# Patient Record
Sex: Male | Born: 1935 | Race: White | Hispanic: No | Smoking: Former smoker
Health system: Southern US, Community
[De-identification: ages and names within clinical notes are randomized; demographics above are authoritative.]

## PROBLEM LIST (undated history)

## (undated) DIAGNOSIS — K219 Gastro-esophageal reflux disease without esophagitis: Secondary | ICD-10-CM

## (undated) DIAGNOSIS — I2510 Atherosclerotic heart disease of native coronary artery without angina pectoris: Secondary | ICD-10-CM

## (undated) DIAGNOSIS — M1990 Unspecified osteoarthritis, unspecified site: Secondary | ICD-10-CM

## (undated) DIAGNOSIS — R2681 Unsteadiness on feet: Secondary | ICD-10-CM

## (undated) DIAGNOSIS — R350 Frequency of micturition: Secondary | ICD-10-CM

## (undated) DIAGNOSIS — E785 Hyperlipidemia, unspecified: Secondary | ICD-10-CM

## (undated) DIAGNOSIS — H5440 Blindness, one eye, unspecified eye: Secondary | ICD-10-CM

## (undated) DIAGNOSIS — M316 Other giant cell arteritis: Secondary | ICD-10-CM

## (undated) DIAGNOSIS — C801 Malignant (primary) neoplasm, unspecified: Secondary | ICD-10-CM

## (undated) DIAGNOSIS — H9193 Unspecified hearing loss, bilateral: Secondary | ICD-10-CM

## (undated) HISTORY — PX: CORONARY ANGIOPLASTY: SHX604

## (undated) HISTORY — PX: OTHER SURGICAL HISTORY: SHX169

## (undated) HISTORY — PX: TONSILLECTOMY: SUR1361

## (undated) HISTORY — DX: Hyperlipidemia, unspecified: E78.5

## (undated) HISTORY — DX: Other giant cell arteritis: M31.6

---

## 1998-08-27 ENCOUNTER — Ambulatory Visit (HOSPITAL_BASED_OUTPATIENT_CLINIC_OR_DEPARTMENT_OTHER): Admission: RE | Admit: 1998-08-27 | Discharge: 1998-08-27 | Payer: Self-pay | Admitting: Orthopedic Surgery

## 2001-01-08 ENCOUNTER — Encounter: Payer: Self-pay | Admitting: Emergency Medicine

## 2001-01-08 ENCOUNTER — Emergency Department (HOSPITAL_COMMUNITY): Admission: EM | Admit: 2001-01-08 | Discharge: 2001-01-08 | Payer: Self-pay | Admitting: Emergency Medicine

## 2001-06-10 ENCOUNTER — Ambulatory Visit (HOSPITAL_COMMUNITY): Admission: RE | Admit: 2001-06-10 | Discharge: 2001-06-11 | Payer: Self-pay | Admitting: Interventional Cardiology

## 2001-07-20 ENCOUNTER — Encounter (HOSPITAL_COMMUNITY): Admission: RE | Admit: 2001-07-20 | Discharge: 2001-10-18 | Payer: Self-pay | Admitting: Interventional Cardiology

## 2002-08-30 ENCOUNTER — Encounter: Payer: Self-pay | Admitting: Urology

## 2002-08-30 ENCOUNTER — Ambulatory Visit (HOSPITAL_COMMUNITY): Admission: RE | Admit: 2002-08-30 | Discharge: 2002-08-30 | Payer: Self-pay | Admitting: Urology

## 2002-09-06 ENCOUNTER — Ambulatory Visit: Admission: RE | Admit: 2002-09-06 | Discharge: 2002-12-05 | Payer: Self-pay

## 2003-10-05 ENCOUNTER — Inpatient Hospital Stay (HOSPITAL_BASED_OUTPATIENT_CLINIC_OR_DEPARTMENT_OTHER): Admission: RE | Admit: 2003-10-05 | Discharge: 2003-10-05 | Payer: Self-pay | Admitting: Interventional Cardiology

## 2003-12-29 ENCOUNTER — Ambulatory Visit (HOSPITAL_COMMUNITY): Admission: RE | Admit: 2003-12-29 | Discharge: 2003-12-29 | Payer: Self-pay | Admitting: Gastroenterology

## 2005-02-04 ENCOUNTER — Ambulatory Visit (HOSPITAL_COMMUNITY): Admission: RE | Admit: 2005-02-04 | Discharge: 2005-02-04 | Payer: Self-pay | Admitting: Ophthalmology

## 2005-06-26 ENCOUNTER — Emergency Department (HOSPITAL_COMMUNITY): Admission: EM | Admit: 2005-06-26 | Discharge: 2005-06-26 | Payer: Self-pay | Admitting: Emergency Medicine

## 2005-10-02 ENCOUNTER — Encounter (INDEPENDENT_AMBULATORY_CARE_PROVIDER_SITE_OTHER): Payer: Self-pay | Admitting: *Deleted

## 2005-10-02 ENCOUNTER — Ambulatory Visit: Admission: RE | Admit: 2005-10-02 | Discharge: 2005-10-02 | Payer: Self-pay | Admitting: Internal Medicine

## 2005-10-21 ENCOUNTER — Ambulatory Visit (HOSPITAL_COMMUNITY): Admission: RE | Admit: 2005-10-21 | Discharge: 2005-10-21 | Payer: Self-pay | Admitting: General Surgery

## 2005-10-21 ENCOUNTER — Encounter (INDEPENDENT_AMBULATORY_CARE_PROVIDER_SITE_OTHER): Payer: Self-pay | Admitting: *Deleted

## 2006-04-07 ENCOUNTER — Encounter: Admission: RE | Admit: 2006-04-07 | Discharge: 2006-05-06 | Payer: Self-pay | Admitting: Internal Medicine

## 2006-05-07 ENCOUNTER — Encounter: Admission: RE | Admit: 2006-05-07 | Discharge: 2006-05-25 | Payer: Self-pay | Admitting: Internal Medicine

## 2006-06-19 ENCOUNTER — Encounter: Admission: RE | Admit: 2006-06-19 | Discharge: 2006-06-19 | Payer: Self-pay | Admitting: Orthopedic Surgery

## 2006-07-03 ENCOUNTER — Encounter: Admission: RE | Admit: 2006-07-03 | Discharge: 2006-07-03 | Payer: Self-pay | Admitting: Orthopedic Surgery

## 2006-08-07 ENCOUNTER — Encounter: Admission: RE | Admit: 2006-08-07 | Discharge: 2006-08-07 | Payer: Self-pay | Admitting: Internal Medicine

## 2008-02-03 ENCOUNTER — Encounter: Admission: RE | Admit: 2008-02-03 | Discharge: 2008-02-03 | Payer: Self-pay | Admitting: Sports Medicine

## 2008-02-17 ENCOUNTER — Encounter: Admission: RE | Admit: 2008-02-17 | Discharge: 2008-02-17 | Payer: Self-pay | Admitting: Sports Medicine

## 2008-07-16 ENCOUNTER — Ambulatory Visit: Payer: Self-pay | Admitting: Cardiology

## 2008-07-16 ENCOUNTER — Inpatient Hospital Stay (HOSPITAL_COMMUNITY): Admission: EM | Admit: 2008-07-16 | Discharge: 2008-07-18 | Payer: Self-pay | Admitting: Emergency Medicine

## 2008-08-30 ENCOUNTER — Encounter (HOSPITAL_COMMUNITY): Admission: RE | Admit: 2008-08-30 | Discharge: 2008-11-28 | Payer: Self-pay | Admitting: Interventional Cardiology

## 2008-12-08 ENCOUNTER — Encounter (HOSPITAL_COMMUNITY): Admission: RE | Admit: 2008-12-08 | Discharge: 2009-02-06 | Payer: Self-pay | Admitting: Interventional Cardiology

## 2009-02-07 ENCOUNTER — Encounter (HOSPITAL_COMMUNITY): Admission: RE | Admit: 2009-02-07 | Discharge: 2009-05-08 | Payer: Self-pay | Admitting: Interventional Cardiology

## 2009-10-11 ENCOUNTER — Emergency Department (HOSPITAL_COMMUNITY): Admission: EM | Admit: 2009-10-11 | Discharge: 2009-10-11 | Payer: Self-pay | Admitting: Emergency Medicine

## 2009-10-25 ENCOUNTER — Encounter: Admission: RE | Admit: 2009-10-25 | Discharge: 2009-10-25 | Payer: Self-pay | Admitting: Internal Medicine

## 2010-08-27 LAB — DIFFERENTIAL
Eosinophils Relative: 1 % (ref 0–5)
Lymphocytes Relative: 15 % (ref 12–46)
Lymphs Abs: 1.4 10*3/uL (ref 0.7–4.0)
Monocytes Absolute: 0.5 10*3/uL (ref 0.1–1.0)
Monocytes Relative: 6 % (ref 3–12)

## 2010-08-27 LAB — URINALYSIS, ROUTINE W REFLEX MICROSCOPIC
Hgb urine dipstick: NEGATIVE
Ketones, ur: 15 mg/dL — AB
Nitrite: NEGATIVE
Specific Gravity, Urine: 1.026 (ref 1.005–1.030)
pH: 5.5 (ref 5.0–8.0)

## 2010-08-27 LAB — BASIC METABOLIC PANEL
BUN: 22 mg/dL (ref 6–23)
Creatinine, Ser: 1.06 mg/dL (ref 0.4–1.5)
GFR calc Af Amer: >60 mL/min (ref >60)
Glucose, Bld: 124 mg/dL — ABNORMAL HIGH (ref 70–99)
Sodium: 136 mEq/L (ref 135–145)

## 2010-08-27 LAB — CBC
HCT: 39.6 % (ref 39.0–52.0)
Hemoglobin: 13.9 g/dL (ref 13.0–17.0)
MCHC: 35.1 g/dL (ref 30.0–36.0)
MCV: 98.6 fL (ref 78.0–100.0)
RDW: 14.2 % (ref 11.5–15.5)

## 2010-09-24 LAB — DIFFERENTIAL
Basophils Absolute: 0 10*3/uL (ref 0.0–0.1)
Basophils Relative: 1 % (ref 0–1)
Eosinophils Absolute: 0 10*3/uL (ref 0.0–0.7)
Eosinophils Relative: 0 % (ref 0–5)
Lymphocytes Relative: 15 % (ref 12–46)
Lymphs Abs: 1.2 10*3/uL (ref 0.7–4.0)
Monocytes Absolute: 0.5 10*3/uL (ref 0.1–1.0)
Monocytes Relative: 6 % (ref 3–12)
Neutro Abs: 6 10*3/uL (ref 1.7–7.7)
Neutrophils Relative %: 77 % (ref 43–77)

## 2010-09-24 LAB — CK TOTAL AND CKMB (NOT AT ARMC)
CK, MB: 2.8 ng/mL (ref 0.3–4.0)
CK, MB: 3.2 ng/mL (ref 0.3–4.0)
CK, MB: 3.3 ng/mL (ref 0.3–4.0)
Relative Index: INVALID (ref 0.0–2.5)
Relative Index: INVALID (ref 0.0–2.5)
Relative Index: INVALID (ref 0.0–2.5)
Total CK: 74 U/L (ref 7–232)
Total CK: 89 U/L (ref 7–232)
Total CK: 90 U/L (ref 7–232)

## 2010-09-24 LAB — TROPONIN I
Troponin I: <0.01 ng/mL (ref 0.00–0.06)
Troponin I: <0.01 ng/mL (ref 0.00–0.06)
Troponin I: <0.01 ng/mL (ref 0.00–0.06)

## 2010-09-24 LAB — URINALYSIS, ROUTINE W REFLEX MICROSCOPIC
Bilirubin Urine: NEGATIVE
Glucose, UA: NEGATIVE mg/dL
Hgb urine dipstick: NEGATIVE
Ketones, ur: NEGATIVE mg/dL
Nitrite: NEGATIVE
Protein, ur: NEGATIVE mg/dL
Specific Gravity, Urine: 1.028 (ref 1.005–1.030)
Urobilinogen, UA: 0.2 mg/dL (ref 0.0–1.0)
pH: 6 (ref 5.0–8.0)

## 2010-09-24 LAB — BASIC METABOLIC PANEL
BUN: 28 mg/dL — ABNORMAL HIGH (ref 6–23)
CO2: 27 mEq/L (ref 19–32)
GFR calc Af Amer: >60 mL/min (ref >60)
Glucose, Bld: 124 mg/dL — ABNORMAL HIGH (ref 70–99)
Potassium: 4.3 mEq/L (ref 3.5–5.1)
Sodium: 137 mEq/L (ref 135–145)

## 2010-09-24 LAB — URINE CULTURE
Colony Count: NO GROWTH
Culture: NO GROWTH
Special Requests: NEGATIVE

## 2010-09-24 LAB — CBC
Hemoglobin: 12.4 g/dL — ABNORMAL LOW (ref 13.0–17.0)
Hemoglobin: 13.6 g/dL (ref 13.0–17.0)
MCHC: 34.1 g/dL (ref 30.0–36.0)
RBC: 3.65 MIL/uL — ABNORMAL LOW (ref 4.22–5.81)
RDW: 14 % (ref 11.5–15.5)
WBC: 7.7 10*3/uL (ref 4.0–10.5)

## 2010-09-24 LAB — PROTIME-INR
INR: 1.1 (ref 0.00–1.49)
Prothrombin Time: 14 seconds (ref 11.6–15.2)

## 2010-09-24 LAB — POCT CARDIAC MARKERS
CKMB, poc: 1.3 ng/mL (ref 1.0–8.0)
CKMB, poc: 1.3 ng/mL (ref 1.0–8.0)

## 2010-09-24 LAB — APTT: aPTT: 27 seconds (ref 24–37)

## 2010-09-27 ENCOUNTER — Other Ambulatory Visit: Payer: Self-pay | Admitting: Urology

## 2010-09-27 ENCOUNTER — Encounter (HOSPITAL_COMMUNITY): Payer: Medicare Other | Attending: Urology

## 2010-09-27 DIAGNOSIS — C61 Malignant neoplasm of prostate: Secondary | ICD-10-CM | POA: Insufficient documentation

## 2010-09-27 DIAGNOSIS — Z01812 Encounter for preprocedural laboratory examination: Secondary | ICD-10-CM | POA: Insufficient documentation

## 2010-09-27 DIAGNOSIS — Z79899 Other long term (current) drug therapy: Secondary | ICD-10-CM | POA: Insufficient documentation

## 2010-09-27 DIAGNOSIS — Z7982 Long term (current) use of aspirin: Secondary | ICD-10-CM | POA: Insufficient documentation

## 2010-09-27 DIAGNOSIS — E785 Hyperlipidemia, unspecified: Secondary | ICD-10-CM | POA: Insufficient documentation

## 2010-09-27 DIAGNOSIS — C679 Malignant neoplasm of bladder, unspecified: Secondary | ICD-10-CM | POA: Insufficient documentation

## 2010-09-27 DIAGNOSIS — M1710 Unilateral primary osteoarthritis, unspecified knee: Secondary | ICD-10-CM | POA: Insufficient documentation

## 2010-09-27 LAB — BASIC METABOLIC PANEL
Calcium: 8.8 mg/dL (ref 8.4–10.5)
Creatinine, Ser: 0.97 mg/dL (ref 0.4–1.5)
GFR calc Af Amer: >60 mL/min (ref >60)
GFR calc non Af Amer: >60 mL/min (ref >60)
Sodium: 141 mEq/L (ref 135–145)

## 2010-09-27 LAB — CBC
MCH: 31.1 pg (ref 26.0–34.0)
MCHC: 31.9 g/dL (ref 30.0–36.0)
Platelets: 166 10*3/uL (ref 150–400)
RDW: 14.6 % (ref 11.5–15.5)

## 2010-09-27 LAB — SURGICAL PCR SCREEN
MRSA, PCR: NEGATIVE
Staphylococcus aureus: NEGATIVE

## 2010-10-04 ENCOUNTER — Ambulatory Visit (HOSPITAL_COMMUNITY)
Admission: RE | Admit: 2010-10-04 | Discharge: 2010-10-05 | Disposition: A | Discharge status: Home / Self Care | Payer: Medicare Other | Source: Ambulatory Visit | Attending: Urology | Admitting: Urology

## 2010-10-04 ENCOUNTER — Other Ambulatory Visit: Payer: Self-pay | Admitting: Urology

## 2010-10-04 DIAGNOSIS — M1710 Unilateral primary osteoarthritis, unspecified knee: Secondary | ICD-10-CM | POA: Insufficient documentation

## 2010-10-04 DIAGNOSIS — M353 Polymyalgia rheumatica: Secondary | ICD-10-CM | POA: Insufficient documentation

## 2010-10-04 DIAGNOSIS — R0789 Other chest pain: Secondary | ICD-10-CM | POA: Insufficient documentation

## 2010-10-04 DIAGNOSIS — I2510 Atherosclerotic heart disease of native coronary artery without angina pectoris: Secondary | ICD-10-CM | POA: Insufficient documentation

## 2010-10-04 DIAGNOSIS — Z7982 Long term (current) use of aspirin: Secondary | ICD-10-CM | POA: Insufficient documentation

## 2010-10-04 DIAGNOSIS — K219 Gastro-esophageal reflux disease without esophagitis: Secondary | ICD-10-CM | POA: Insufficient documentation

## 2010-10-04 DIAGNOSIS — C675 Malignant neoplasm of bladder neck: Secondary | ICD-10-CM | POA: Insufficient documentation

## 2010-10-04 DIAGNOSIS — E785 Hyperlipidemia, unspecified: Secondary | ICD-10-CM | POA: Insufficient documentation

## 2010-10-04 DIAGNOSIS — C61 Malignant neoplasm of prostate: Secondary | ICD-10-CM | POA: Insufficient documentation

## 2010-10-04 DIAGNOSIS — R310 Gross hematuria: Secondary | ICD-10-CM | POA: Insufficient documentation

## 2010-10-04 DIAGNOSIS — Z79899 Other long term (current) drug therapy: Secondary | ICD-10-CM | POA: Insufficient documentation

## 2010-10-04 NOTE — Op Note (Signed)
  NAMEMAZI, BRAILSFORD NO.:  [id number]  MEDICAL RECORD NO.:  [id number]           PATIENT TYPE:  O  LOCATION:  DAYL                         FACILITY:  [facility]  PHYSICIAN:  Gaspar Fowle I. Patsi Sears, M.D.DATE OF BIRTH:  1918/05/12  DATE OF PROCEDURE:  10/04/2010 DATE OF DISCHARGE:                              OPERATIVE REPORT   PREOPERATIVE DIAGNOSIS:  Right lateral bladder neck bladder cancer.  POSTOPERATIVE DIAGNOSIS:  Right lateral bladder neck bladder cancer.  OPERATION:  Transurethral resection of the right lobe of the prostate and transurethral resection of bladder neck bladder cancer.  SURGEON:  Steffen Hase I. Patsi Sears, M.D.  ANESTHESIA:  General LMA.  PREPARATION:  After appropriate preanesthesia, the patient was brought to the operating room, placed on the operating room table in dorsal supine position where general LMA anesthesia was introduced.  He was then replaced in dorsal lithotomy position where the pubis was prepped with Betadine solution and draped in usual fashion.  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  He was treated with external beam radiation therapy in 2003 per Dr. Dan Humphreys.  PSA was 0.74 in [place] in 2008.  The patient has been on oxybutynin for radiation cystitis as well as urge incontinence.  The patient had recently been seen by Dr. Perrin Maltese at urgent care for upper respiratory infection with urine specimen showing urinary tract infection and microhematuria.  He was treated with antibiotics for UTI and CT shows bladder mass.  Cystoscopy showed bladder cancer at the bladder neck, behind the right lobe of the prostate.  He is now for resection.  PROCEDURE:  Cystourethroscopy and photo documentation were accomplished of the bladder  neck, bladder cancer, located at the bladder neck and behind the right lobe of the prostate.  A transurethral resection of the right lobe of prostate is accomplished and also resection of the bladder neck to remove all tumor.  The tumor appeared to be superficial.  It is appropriate for mitomycin use in the recovery room.  The patient tolerated the procedure well.  He was awakened and taken to recovery room in good condition.    Nainoa Woldt I. Patsi Sears, M.D.    SIT/MEDQ  D:  10/04/2010  T:  10/04/2010  Job:  [id number]  Electronically Signed by Jethro Bolus M.D. on 10/04/2010 04:54:09 PM

## 2010-10-22 NOTE — Discharge Summary (Signed)
NAMERIESE, HELLARD NO.:  [id number]   MEDICAL RECORD NO.:  [id number]          PATIENT TYPE:  INP   LOCATION:  1411                         FACILITY:  [facility]   PHYSICIAN:  Theressa Millard, M.D.    DATE OF BIRTH:  1917-11-05   DATE OF ADMISSION:  07/16/2008  DATE OF DISCHARGE:  07/18/2008                               DISCHARGE SUMMARY   ADMISSION DIAGNOSIS:  Bilateral lower extremity weakness.   DISCHARGE DIAGNOSES:  1. Bilateral lower extremity weakness, questionable etiology.  2. Fall secondary to number 1.  3. Coronary artery disease.  4. Temporal arteritis, biopsy proven.  5. History of imbalance and falls requiring balance physical therapy      in the past.   HISTORY:  The patient is an 77 year old white male who rather suddenly  developed bilateral lower extremity weakness and was unable to stand.  He fell at home and was helped up by family members.  He could not get  up out of a chair after that.  He is admitted for further evaluation.   HOSPITAL COURSE:  The patient was admitted and initial laboratory data  were unrevealing.  A D-dimer was moderately elevated and a CT scan of  the chest was done.  This ruled out a pulmonary embolism.  Serial  enzymes were negative and EKGs were negative, so it was not thought the  patient has suffered acute coronary syndrome or other coronary event.   He was seen by PT who recommended a rolling walker and home health PT.  He was discharged in improved condition.   DISCHARGE MEDICATIONS:  1. Prednisone 1 mg 2 tablets daily.  2. Aspirin 81 mg daily.  3. Oxybutynin 5 mg b.i.d.  4. Atenolol 25 mg daily.  5. Isosorbide mononitrate 120 mg daily.  6. Pravastatin 40 mg daily.  7. Ranexa 500 mg b.i.d..   FOLLOW UP:  He has an appointment with me in April and will keep that  appointment.   ACTIVITY:  Per home health PT, use rolling walker.   DIET:  No added salt.      Theressa Millard, M.D.  Electronically  Signed     JO/MEDQ  D:  07/18/2008  T:  07/18/2008  Job:  [id number]

## 2010-10-22 NOTE — H&P (Signed)
NAMEMOHAMUD, Derrick Andrews NO.:  [id number]   MEDICAL RECORD NO.:  [id number]          PATIENT TYPE:  EMS   LOCATION:  ED                           FACILITY:  [facility]   PHYSICIAN:  Darryl D. Prime, MD    DATE OF BIRTH:  1917-09-05   DATE OF ADMISSION:  07/16/2008  DATE OF DISCHARGE:                              HISTORY & PHYSICAL   CHIEF COMPLAINT:  Not feeling right, weak all over, thought I was  having a heart attack.   HISTORY OF PRESENT ILLNESS:  Derrick Andrews is an 77 year old male with a  history of coronary artery disease who noticed today around 11:30 a.m.,  profound weakness, shortness of breath, unsteady gait because of the leg  weakness.  He sat down and took a nitroglycerin because he thought he  was having a heart attack, laid down for two hours, took a nap, woke up,  and still could not walk well.  He also had upper extremity weakness.  The patient was brought here by his daughter who he lives with.  He has  been eating and drinking okay.  He has not had any chest pain.  He  denies any paroxysmal or nocturnal dyspnea.  He denies any orthopnea.  He denies any lower extremity edema.  The patient was noted by his niece  and his daughter to be ashen all week and not looking right.   PAST MEDICAL/SURGICAL HISTORY:  1. History of coronary artery disease, status post PCI in 2003.  2. LAD cath, last here in April of 2005 showing mild to moderate LAD      in stent stenosis, medically managed, EF of 65%.  He had a mildly      abnormal Cardiolite at that time which prompted the study.  The      patient notes he has had PCI since then and a stress test about 4-5      months ago that was unremarkable.  3. History of hyperlipidemia.  4. History of hypertension.  5. History of back pain.  6. Hearing aid status.  7. He has a history of blindness of the left eye, likely due to      temporal arteritis.  He is on prednisone for this.  8. History of fall in January 2007 with  significant fractures in the      hands requiring reductions of this.   ALLERGIES:  No known drug allergies.   MEDICATIONS:  1. He is on nitroglycerin sublingual as needed.  2. Prednisone 2 mg daily.  3. Pravastatin possibly 80 mg daily.  4. Ranexa 500 mg twice a day.  5. Isosorbide mononitrate 60 mg daily.  6. __________, unsure of the exact dose.  7. Atenolol.  He is unsure of the exact dose.  8. Oxybutynin twice a day, unsure of the exact dose.  9. Aspirin 81 mg daily.   SOCIAL HISTORY:  No current tobacco, alcohol, or drug use.  He works at  [employer].  Good ADLs.   FAMILY HISTORY:  Father died of heart failure.   REVIEW OF  SYSTEMS:  A 14-point review of systems negative unless stated  above.   PHYSICAL EXAMINATION:  VITAL SIGNS:  Temperature 97 with blood pressure  of 107/60, pulse 62, respiratory rate of 14, saturations are 96% on room  air.  GENERAL:  In general the patient is in no acute distress.  He notes  improvement of his symptoms after receiving IV fluids.  HEENT:  Normocephalic, atraumatic.  Pupils equal, round, and reactive to  light with extraocular movements intact.  NECK:  Supple with no lymphadenopathy or thyromegaly.  No jugular venous  distention.  No carotid bruits.  LUNGS:  Clear to auscultation bilaterally.  CARDIOVASCULAR:  Regular rate and rhythm with no murmurs, rubs, or  gallops.  Normal S1/S2.  No S3 or S4.  ABDOMEN:  Soft, nontender, nondistended.  No hepatosplenomegaly.  EXTREMITIES:  Show no clubbing, cyanosis, or edema.  NEUROLOGIC:  He is alert and oriented x4.  Cranial nerves II-XII are  grossly intact.  Strength and sensation grossly intact.   LABORATORY DATA:  The patient's white count is 7.7 with hemoglobin of  13.7, hematocrit 39.7, platelets 125, segs were 77%.  Sodium 137 with a  potassium of 4.3, chloride 107, bicarb 27, BUN 28, creatinine 1, glucose  128.  Calcium is 8.9.  Cardiac markers were negative.   Chest  x-ray showed no evidence of cardiopulmonary disease.  EKG showed  sinus bradycardia at 58 beats per minute, QT corrected 455.   ASSESSMENT/PLAN:  This is a patient with a history of significant  coronary artery disease who presents with profound weakness who thought  he was having a heart attack.  PTIs in the past, but this time he was  admitted for acute coronary syndrome given his recent ashen history per  the family and this feeling that he is having a heart attack.  He will  be on heparin and aspirin and will get a cardiology consult in the  morning.  Will transfer him to [facility] as well given possible acute  coronary syndrome status.  Will continue his other medications but will  hold isosorbide mononitrate and will give general hydration and maybe a  component of hypovolemia possibly with orthostasis.  He was already  given fluids in the ER.      Darryl D. Prime, MD  Electronically Signed     DDP/MEDQ  D:  07/16/2008  T:  07/17/2008  Job:  [id number]

## 2010-10-25 NOTE — Op Note (Signed)
NAMEMONTRE, Andrews                 ACCOUNT NO.:  [id number]   MEDICAL RECORD NO.:  [id number]          PATIENT TYPE:  AMB   LOCATION:  DAY                          FACILITY:  [facility]   PHYSICIAN:  Timothy E. Earlene Plater, M.D. DATE OF BIRTH:  04/26/1918   DATE OF PROCEDURE:  10/21/2005  DATE OF DISCHARGE:                                 OPERATIVE REPORT   PREOPERATIVE DIAGNOSIS:  Probable arteritis.   POSTOPERATIVE DIAGNOSIS:  Probable arteritis.   PROCEDURE:  Bilateral temporal artery biopsy.   SURGEON:  Timothy E. Earlene Plater, M.D.   ANESTHESIA:  Local with IV sedation.   [name] is 5, quite healthy for his age, under full care of Theressa Millard,  M.D., had recent sudden loss of vision, left eye.  Workup by ophthalmology  specialist suggested arteritis.  The patient was placed on 60 mg of  prednisone daily and temporal artery biopsies were recommended by his  ophthalmologist and internist.  I agreed, the patient agreed.  He was seen,  identified and a permit signed.   The patient was taken to the operating room and placed supine, carefully  positioned.  Just a small amount of IV sedation used.  Both sides of the  scalp were prepped with Betadine.  Using the Doppler, separately, of course,  on each side, the temporal artery was clearly identified and the skin  marked.  Local anesthesia was provided with 1% Xylocaine with no epinephrine  and a vertical incision made over the marked areas.  Each was treated  exactly the same.  The artery was identified visually, again verified by  ultrasound Doppler, and then isolated, clipped proximally and distally and a  1 cm segment taken from each temporal artery.  Bleeding had been controlled.  The edges were touched up with the cautery where needed and the wounds were  perfectly dry.  The wounds were each closed with subcuticular 3-0 Vicryl and  the edges were perfectly apposed.  The edges were sealed with Ethibond.  Counts correct.  He tolerated it  well, was removed to the recovery room.   Written and verbal instructions were given him and his family, and he will  be followed as an outpatient.      Timothy E. Earlene Plater, M.D.  Electronically Signed     TED/MEDQ  D:  10/21/2005  T:  10/21/2005  Job:  [id number]   cc:   Theressa Millard, M.D.  Fax: [phone]

## 2010-10-25 NOTE — Cardiovascular Report (Signed)
NAME:  AKRAM, KISSICK                           ACCOUNT NO.:  [id number]   MEDICAL RECORD NO.:  [id number]                   PATIENT TYPE:  OIB   LOCATION:  6501                                 FACILITY:  [facility]   PHYSICIAN:  Lesleigh Noe, M.D.            DATE OF BIRTH:  [birth year]   DATE OF PROCEDURE:  10/05/2003  DATE OF DISCHARGE:  10/05/2003                              CARDIAC CATHETERIZATION   INDICATIONS FOR PROCEDURE:  Exertional angina, mildly abnormal Cardiolite,  history of LAD stent in 2003.   DATE OF PROCEDURE:  Nov 04, 2003.   PROCEDURE PERFORMED:  1. Left heart catheterization.  2. Selective coronary angiography.  3. Left ventriculography.   DESCRIPTION:  After informed consent, a 4-French sheath was placed in the  right femoral artery using modified Seldinger technique.  A 4-French #4 left  Judkins catheter was used for left coronary angiography and a 4-French right  Judkins catheter was used for right coronary angiography and left  ventriculography as well as hemodynamic recordings.  The patient tolerated  the procedure without complications.  During left coronary injections, 200  mcg of intracoronary nitroglycerin was administered.  No complications  occurred during the procedure.   RESULTS:   I. HEMODYNAMIC DATA:  A.  Aortic pressure 125/53.  B.  Left ventricular pressure 132/13.   II. LEFT VENTRICULOGRAPHY:  The left ventricle is normal in size and  exhibits normal contractility.  EF of 65%.  No regional wall motion  abnormality.   III. CORONARY ANGIOGRAPHY:  A.  Left main coronary:  Calcified, but widely  patent.  B.  Left anterior descending coronary:  The LAD is heavily calcified.  There  is a proximal/mid LAD stent.  In the proximal 1/3 of the stent there is a  region of restenosis that seems to obstruct the stent by 40-60%.  The very  worse view is in the LAO cranial view, but there appears to be a 60-65%  narrowing within this region.  The  LAD otherwise contains luminal  irregularities.  The first diagonal is large and contains a 50% ostial  narrowing.  LAD wraps around the left ventricular apex.  C.  Circumflex artery:  Large giving origin to small first and third obtuse  marginal and a large second obtuse marginal.  No significant obstruction is  noted.  Luminal irregularities are seen.  D.  Right coronary:  Large and somewhat ectatic with diffuse luminal  irregularities up to 30-40% narrowing.  The PDA and two left ventricular  branches are free of any significant high grade obstruction.   CONCLUSIONS:  1. Mild-to-moderate left anterior descending in-stent restenosis in the 50-     60% range.  2. Otherwise widely patent coronaries.  There is heavy calcification in all     three coronary beds.  3. Left ventricular function is normal.   RECOMMENDATIONS:  Continue medical therapy.  Review cine angiograms  performed at the time of stent implantation.  If symptoms continue, would  consider redilating the LAD and possibly restenting.                                               Lesleigh Noe, M.D.    HWS/MEDQ  D:  10/05/2003  T:  10/05/2003  Job:  [id number]   cc:   Candyce Churn, M.D.  [address] [place]  [place] [address]  Fax: [phone]

## 2010-11-27 ENCOUNTER — Other Ambulatory Visit: Payer: Self-pay | Admitting: Sports Medicine

## 2010-11-27 DIAGNOSIS — M4800 Spinal stenosis, site unspecified: Secondary | ICD-10-CM

## 2010-11-28 ENCOUNTER — Ambulatory Visit
Admission: RE | Admit: 2010-11-28 | Discharge: 2010-11-28 | Disposition: A | Discharge status: Home / Self Care | Payer: Medicare Other | Source: Ambulatory Visit | Attending: Sports Medicine | Admitting: Sports Medicine

## 2010-11-28 DIAGNOSIS — M4800 Spinal stenosis, site unspecified: Secondary | ICD-10-CM

## 2011-01-15 ENCOUNTER — Other Ambulatory Visit: Payer: Self-pay | Admitting: Sports Medicine

## 2011-01-15 DIAGNOSIS — M4800 Spinal stenosis, site unspecified: Secondary | ICD-10-CM

## 2011-01-16 ENCOUNTER — Ambulatory Visit
Admission: RE | Admit: 2011-01-16 | Discharge: 2011-01-16 | Disposition: A | Discharge status: Home / Self Care | Payer: Medicare Other | Source: Ambulatory Visit | Attending: Sports Medicine | Admitting: Sports Medicine

## 2011-01-16 DIAGNOSIS — M4800 Spinal stenosis, site unspecified: Secondary | ICD-10-CM

## 2011-01-16 MED ORDER — METHYLPREDNISOLONE ACETATE 40 MG/ML INJ SUSP (RADIOLOG
120.0000 mg | Freq: Once | INTRAMUSCULAR | Status: AC
  Administered 2011-01-16: 120 mg via EPIDURAL

## 2011-01-16 MED ORDER — IOHEXOL 180 MG/ML  SOLN
1.0000 mL | Freq: Once | INTRAMUSCULAR | Status: AC | PRN
  Administered 2011-01-16: 1 mL via EPIDURAL

## 2011-02-04 ENCOUNTER — Other Ambulatory Visit: Payer: Self-pay | Admitting: Sports Medicine

## 2011-02-04 DIAGNOSIS — M4800 Spinal stenosis, site unspecified: Secondary | ICD-10-CM

## 2011-02-05 ENCOUNTER — Ambulatory Visit
Admission: RE | Admit: 2011-02-05 | Discharge: 2011-02-05 | Disposition: A | Discharge status: Home / Self Care | Payer: Medicare Other | Source: Ambulatory Visit | Attending: Sports Medicine | Admitting: Sports Medicine

## 2011-02-05 DIAGNOSIS — M4800 Spinal stenosis, site unspecified: Secondary | ICD-10-CM

## 2011-02-05 MED ORDER — METHYLPREDNISOLONE ACETATE 40 MG/ML INJ SUSP (RADIOLOG
120.0000 mg | Freq: Once | INTRAMUSCULAR | Status: AC
  Administered 2011-02-05: 120 mg via EPIDURAL

## 2011-02-05 MED ORDER — IOHEXOL 180 MG/ML  SOLN
1.0000 mL | Freq: Once | INTRAMUSCULAR | Status: AC | PRN
  Administered 2011-02-05: 1 mL via EPIDURAL

## 2011-02-25 ENCOUNTER — Other Ambulatory Visit: Payer: Self-pay | Admitting: Urology

## 2011-02-25 ENCOUNTER — Other Ambulatory Visit (HOSPITAL_COMMUNITY): Payer: Self-pay | Admitting: Urology

## 2011-02-25 ENCOUNTER — Ambulatory Visit (HOSPITAL_COMMUNITY)
Admission: RE | Admit: 2011-02-25 | Discharge: 2011-02-25 | Disposition: A | Discharge status: Home / Self Care | Payer: Medicare Other | Source: Ambulatory Visit | Attending: Urology | Admitting: Urology

## 2011-02-25 ENCOUNTER — Encounter (HOSPITAL_COMMUNITY): Payer: Medicare Other

## 2011-02-25 DIAGNOSIS — J449 Chronic obstructive pulmonary disease, unspecified: Secondary | ICD-10-CM | POA: Insufficient documentation

## 2011-02-25 DIAGNOSIS — C679 Malignant neoplasm of bladder, unspecified: Secondary | ICD-10-CM | POA: Insufficient documentation

## 2011-02-25 DIAGNOSIS — J4489 Other specified chronic obstructive pulmonary disease: Secondary | ICD-10-CM | POA: Insufficient documentation

## 2011-02-25 DIAGNOSIS — Z01818 Encounter for other preprocedural examination: Secondary | ICD-10-CM

## 2011-02-25 DIAGNOSIS — Z01812 Encounter for preprocedural laboratory examination: Secondary | ICD-10-CM | POA: Insufficient documentation

## 2011-02-25 DIAGNOSIS — J984 Other disorders of lung: Secondary | ICD-10-CM | POA: Insufficient documentation

## 2011-02-25 LAB — BASIC METABOLIC PANEL
BUN: 20 mg/dL (ref 6–23)
Chloride: 104 mEq/L (ref 96–112)
GFR calc Af Amer: >60 mL/min (ref >60)
Potassium: 4.1 mEq/L (ref 3.5–5.1)

## 2011-02-25 LAB — SURGICAL PCR SCREEN
MRSA, PCR: NEGATIVE
Staphylococcus aureus: NEGATIVE

## 2011-02-25 LAB — CBC
HCT: 34 % — ABNORMAL LOW (ref 39.0–52.0)
Platelets: 128 10*3/uL — ABNORMAL LOW (ref 150–400)
RDW: 15.3 % (ref 11.5–15.5)
WBC: 7.7 10*3/uL (ref 4.0–10.5)

## 2011-03-03 ENCOUNTER — Other Ambulatory Visit: Payer: Self-pay | Admitting: Urology

## 2011-03-03 ENCOUNTER — Ambulatory Visit (HOSPITAL_COMMUNITY)
Admission: RE | Admit: 2011-03-03 | Discharge: 2011-03-03 | Disposition: A | Discharge status: Home / Self Care | Payer: Medicare Other | Source: Ambulatory Visit | Attending: Urology | Admitting: Urology

## 2011-03-03 DIAGNOSIS — R3915 Urgency of urination: Secondary | ICD-10-CM | POA: Insufficient documentation

## 2011-03-03 DIAGNOSIS — R350 Frequency of micturition: Secondary | ICD-10-CM | POA: Insufficient documentation

## 2011-03-03 DIAGNOSIS — C672 Malignant neoplasm of lateral wall of bladder: Secondary | ICD-10-CM | POA: Insufficient documentation

## 2011-03-03 DIAGNOSIS — I10 Essential (primary) hypertension: Secondary | ICD-10-CM | POA: Insufficient documentation

## 2011-03-03 DIAGNOSIS — C61 Malignant neoplasm of prostate: Secondary | ICD-10-CM | POA: Insufficient documentation

## 2011-03-03 DIAGNOSIS — R310 Gross hematuria: Secondary | ICD-10-CM | POA: Insufficient documentation

## 2011-03-03 DIAGNOSIS — Z01812 Encounter for preprocedural laboratory examination: Secondary | ICD-10-CM | POA: Insufficient documentation

## 2011-03-03 DIAGNOSIS — Y842 Radiological procedure and radiotherapy as the cause of abnormal reaction of the patient, or of later complication, without mention of misadventure at the time of the procedure: Secondary | ICD-10-CM | POA: Insufficient documentation

## 2011-03-03 DIAGNOSIS — Z9861 Coronary angioplasty status: Secondary | ICD-10-CM | POA: Insufficient documentation

## 2011-03-03 DIAGNOSIS — N419 Inflammatory disease of prostate, unspecified: Secondary | ICD-10-CM | POA: Insufficient documentation

## 2011-03-03 DIAGNOSIS — N3941 Urge incontinence: Secondary | ICD-10-CM | POA: Insufficient documentation

## 2011-03-03 DIAGNOSIS — K219 Gastro-esophageal reflux disease without esophagitis: Secondary | ICD-10-CM | POA: Insufficient documentation

## 2011-03-03 DIAGNOSIS — Z01818 Encounter for other preprocedural examination: Secondary | ICD-10-CM | POA: Insufficient documentation

## 2011-03-14 NOTE — Op Note (Signed)
  NAMEJAYDIEN, PANEPINTO NO.:  [id number]  MEDICAL RECORD NO.:  [id number]  LOCATION:  DAYL                         FACILITY:  [facility]  PHYSICIAN:  Ellarie Picking I. Patsi Sears, M.D.DATE OF BIRTH:  [birth year]  DATE OF PROCEDURE: DATE OF DISCHARGE:  03/03/2011                              OPERATIVE REPORT   PREOPERATIVE DIAGNOSES: 1. 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 2. Recurrent transitional cell carcinoma of the bladder.  OPERATIONS:  Cystourethroscopy, clot evacuation, transurethral cauterization of bleeding prostate, transurethral cold cup bladder biopsies, and transurethral vaporization of recurrent transitional cell carcinoma of the bladder.  SURGEON:  Nyesha Cliff I. Patsi Sears, M.D.  ANESTHESIA:  General LMA.  OPERATION:  Appropriate preanesthesia, the patient was brought to the operating room and placed on the operating table in dorsal supine position where general LMA anesthesia was induced.  He was then replaced in dorsal lithotomy position where the pubis was prepped with Betadine solution and draped in the usual fashion.  REVIEW OF HISTORY:  This 77 year old male has a history of prostate cancer, treated in 2003, with external beam radiation therapy.  He has been on oxybutynin for urinary urgency, frequency, post radiation therapy, with some urge incontinence.  The patient developed gross hematuria, was found to have bleeding from his prostate.  He has a more recent history of bladder cancer, status post TURBT in April 2012, showing a low grade TA disease in a single area.  Upper tract evaluation showed a single exophytic cyst in the left mid renal pole.  Most recently, the patient developed recurrent gross hematuria, and bleeding in the office was found secondary to prostate bleeding.  He is in now for cauterization of the prostate.  PROCEDURE:  Cystourethroscopy was accomplished, and showed frank bleeding and clot at the bladder neck  emanating from the prostate.  The clots were evacuated free from bladder, and using the button electrode, vaporization of the prostate bed was accomplished with resolution of the bleeding.  Cystoscopy revealed recurrent TCC of the bladder, on the right lateral wall in 2 areas, and this was cold cup bladder biopsied and then vaporized with the button electrode.  The patient tolerated the procedure well.  A 20-French Foley catheter was placed, with 30 mL in the balloon.  Clear efflux was identified from the bladder, and the irrigant was also clear. The patient was awakened and taken to the recovery room in good condition.     Nyasha Rahilly I. Patsi Sears, M.D.     SIT/MEDQ  D:  03/03/2011  T:  03/03/2011  Job:  [id number]  Electronically Signed by Jethro Bolus M.D. on 03/14/2011 12:46:42 PM

## 2011-05-21 ENCOUNTER — Ambulatory Visit (INDEPENDENT_AMBULATORY_CARE_PROVIDER_SITE_OTHER): Payer: Medicare Other

## 2011-05-21 DIAGNOSIS — S81009A Unspecified open wound, unspecified knee, initial encounter: Secondary | ICD-10-CM

## 2011-05-21 DIAGNOSIS — S91009A Unspecified open wound, unspecified ankle, initial encounter: Secondary | ICD-10-CM

## 2011-05-24 ENCOUNTER — Ambulatory Visit (INDEPENDENT_AMBULATORY_CARE_PROVIDER_SITE_OTHER): Payer: Medicare Other

## 2011-05-24 DIAGNOSIS — S81009A Unspecified open wound, unspecified knee, initial encounter: Secondary | ICD-10-CM

## 2011-05-30 ENCOUNTER — Ambulatory Visit (INDEPENDENT_AMBULATORY_CARE_PROVIDER_SITE_OTHER): Payer: Medicare Other

## 2011-05-30 DIAGNOSIS — S81009A Unspecified open wound, unspecified knee, initial encounter: Secondary | ICD-10-CM

## 2011-07-11 ENCOUNTER — Ambulatory Visit (HOSPITAL_BASED_OUTPATIENT_CLINIC_OR_DEPARTMENT_OTHER): Payer: Medicare Other | Admitting: Physical Medicine & Rehabilitation

## 2011-07-11 ENCOUNTER — Encounter: Payer: Medicare Other | Attending: Physical Medicine & Rehabilitation

## 2011-07-11 DIAGNOSIS — M5126 Other intervertebral disc displacement, lumbar region: Secondary | ICD-10-CM | POA: Insufficient documentation

## 2011-07-11 DIAGNOSIS — M545 Low back pain, unspecified: Secondary | ICD-10-CM | POA: Insufficient documentation

## 2011-07-11 DIAGNOSIS — M48061 Spinal stenosis, lumbar region without neurogenic claudication: Secondary | ICD-10-CM | POA: Insufficient documentation

## 2011-07-11 DIAGNOSIS — Z7982 Long term (current) use of aspirin: Secondary | ICD-10-CM | POA: Insufficient documentation

## 2011-07-11 DIAGNOSIS — M47817 Spondylosis without myelopathy or radiculopathy, lumbosacral region: Secondary | ICD-10-CM | POA: Insufficient documentation

## 2011-07-11 DIAGNOSIS — Z79899 Other long term (current) drug therapy: Secondary | ICD-10-CM | POA: Insufficient documentation

## 2011-07-11 NOTE — Progress Notes (Signed)
This is a 78 year old male, who has lumbar spinal stenosis, chronic low back pain of greater than 10 year duration who has primary complaint of low back pain without radicular symptoms.  He has been evaluated by numerous physicians throughout the years including Dr. Fannie Knee and then Dr. Salvatore Marvel.  MRI from 2008, demonstrates facet overgrowth L2-3 and borderline central stenosis at that level.  At L3-4, lateral recess disk protrusion as well as facet overgrowth and moderate central stenosis, mild foraminal stenosis at L4-5, right paracentral lateral disk extrusion, facet overgrowth, moderate central stenosis, moderate left and mild foraminal, right foraminal stenosis at L5-S1, bilateral facet overgrowth, synovial cyst without evidence of significant foraminal stenosis and only moderate central stenosis.  He has undergone epidural injections right L5 transforaminal June 19, 2006 and July 03, 2006 and in February 17, 2008, he also had L4-5 translaminar February 03, 2008 and most recently L4-5 translaminar injections February 05, 2011, January 16, 2011 and November 28, 2010.  He gets transient relief of his back pain from these injections.  His average pain is 8/10 and interferes with activity at 7.  Pain is worse with walking and standing.  He is able to walk without assistance.  He is able to drive, but he does have trouble walking for longer distances. Standing also bothers him.  He has coronary artery disease as well as arthritis.  SOCIAL HISTORY:  Widowed, lives with his daughter.  MEDICATIONS: 1. Tramadol 1 p.o. q.6 h. 2. Isosorbide dinitrate 120 mg sustained action 1 p.o. daily. 3. Oxybutynin 1 p.o. b.i.d. for urinary incontinence. 4. Vitamin D 1.25 mg every weekly. 5. Ferrous sulfate 325 mg per day. 6. Pravastatin 40 mg at bedtime. 7. Prednisone 1 mg 2 p.o. daily. 8. Atenolol 25 mg q.a.m. 9. Aspirin 81 mg q.a.m. 10.Nitroglycerin sublingual tablet 0.4 mg p.r.n. 11.B12 injections  weekly.  ALLERGIES:  None known.  Pharmacy [place] and [place].  PHYSICAL EXAMINATION:  VITAL SIGNS:  Blood pressure 112/43, pulse 104, respiratory rate is 18 and O2 sat 91% on room air.  Weight 190 pounds and height 6 feet 0 inch. GENERAL:  Elderly male in a kyphotic posture in no acute distress. EXTREMITIES:  Upper extremity strength is normal.  Neck range of motion is reduced.  His sensation in the upper and lower extremities is normal. Lower extremity has normal strength, sensation and mildly diminished internal hip rotation, but otherwise knee and ankle range of motion is full.  Deep tendon reflexes are 1+ bilateral upper and lower extremities.  His back has no tenderness to palpation.  He does have limited lumbar extension, but lumbar flexion is 75%.  Gait shows no evidence of toe drag or knee instability.  IMPRESSION:  Lumbar stenosis, but no evidence of radiculopathy.  I believe his pain generator is likely facet joints due to spondylosis.  PLAN:  We will set him up for medial branch blocks, followed by some physical therapy, may need some radiofrequency neurotomy if only temporary relief from medial branch blocks.  Discussed with patient, agrees with plan.     Erick Colace, M.D. Electronically Signed    AEK/MedQ D:  07/11/2011 15:43:46  T:  07/11/2011 19:11:19  Job #:  [id number]  cc:   Alvy Beal, MD Fax: [phone]

## 2011-08-07 ENCOUNTER — Encounter: Payer: Medicare Other | Admitting: Physical Medicine & Rehabilitation

## 2011-09-04 ENCOUNTER — Ambulatory Visit (HOSPITAL_BASED_OUTPATIENT_CLINIC_OR_DEPARTMENT_OTHER): Payer: Medicare Other | Admitting: Physical Medicine & Rehabilitation

## 2011-09-04 ENCOUNTER — Encounter: Payer: Self-pay | Admitting: Physical Medicine & Rehabilitation

## 2011-09-04 ENCOUNTER — Encounter: Payer: Medicare Other | Attending: Physical Medicine & Rehabilitation

## 2011-09-04 VITALS — BP 109/53 | HR 61 | Resp 16 | Ht 75.0 in | Wt 190.0 lb

## 2011-09-04 DIAGNOSIS — M47817 Spondylosis without myelopathy or radiculopathy, lumbosacral region: Secondary | ICD-10-CM | POA: Insufficient documentation

## 2011-09-04 DIAGNOSIS — M5126 Other intervertebral disc displacement, lumbar region: Secondary | ICD-10-CM | POA: Insufficient documentation

## 2011-09-04 DIAGNOSIS — Z79899 Other long term (current) drug therapy: Secondary | ICD-10-CM | POA: Insufficient documentation

## 2011-09-04 DIAGNOSIS — M48061 Spinal stenosis, lumbar region without neurogenic claudication: Secondary | ICD-10-CM | POA: Insufficient documentation

## 2011-09-04 DIAGNOSIS — M545 Low back pain, unspecified: Secondary | ICD-10-CM | POA: Insufficient documentation

## 2011-09-04 DIAGNOSIS — Z7982 Long term (current) use of aspirin: Secondary | ICD-10-CM | POA: Insufficient documentation

## 2011-09-04 NOTE — Progress Notes (Signed)
  PROCEDURE RECORD [facility]   Name: Derrick Andrews DOB:06-Dec-1917 MRN: [id number]  Date:09/04/2011  Physician: Claudette Laws, MD    Nurse/CMA: Noralyn Pick, CMA  Allergies: No Known Allergies  Consent Signed: yes  Is patient diabetic? no    Pregnant: no LMP: No LMP for male patient. (age [age])  Anticoagulants: yes (ASA 81mg ) Anti-inflammatory: no Antibiotics: no  Procedure: Medial Branch Block  Position: Prone Start Time: 12:49pm  End Time: 1:00pm  Fluoro Time: 24 seconds  RN/CMA Noralyn Pick, CMA Noralyn Pick, CMA    Time 12:20pm 1:03pm    BP 109/53 115/47    Pulse 61 72    Respirations 16 16    O2 Sat 98 98    S/S 6 6    Pain Level 7-8/10 7/10     D/C home with Loran Senters, patient A & O X 3, D/C instructions reviewed, and sits independently.

## 2011-09-04 NOTE — Patient Instructions (Signed)
Facet Block A facet block is an injection procedure used to numb nerves near a spinal joint (facet). The injection usually includes a medicine like Novacaine (anesthetic) and a steroid medicine (similar to cortisone). The injections are made directly into the facet joint of the back. They are used for patients with several types of neck or back pain problems (such as worsening arthritis or persistent pain after surgery) that have not been helped with anti-inflammatory medications, exercise programs, physical therapy, and other forms of pain management. Multiple injections may be needed depending on how many joints are involved.  A facet block procedure can be helpful with diagnosis as well as providing therapeutic pain relief. One of three things may happen after the procedure:  The pain does not go away. This can mean that the pain is probably not coming from blocked facet joints. This information is helpful with diagnosis.   The pain goes away and stays away for a few hours but the original pain comes back and does not get better again. This information is also helpful with diagnosis. It can mean that pain is probably coming from the joints; but the steroid was not helpful for longer term pain control.   The pain goes away after the block, then returns later that day, and then gets better again over the next few days. This can mean that the block was helpful for pain control and the steroid had a longer lasting effect.  If there is good, lasting benefit from the injections, the block may be repeated from 3 to 5 times. If there is good relief but it is only of short-term benefit, other procedures (such as radiofrequency lesioning) may be considered.  Note: The procedure cannot be performed if you have an active infection, a lesion on or near the area of injection, flu, cold, fever, very high blood pressure or if you are on blood thinners. Please make your doctor aware of any of these conditions. This is  for your safety!  LET YOUR CAREGIVER KNOW ABOUT:   Allergies.   Medications taken including herbs, eye drops, over the counter medications, and creams   Use of steroids (by mouth or creams).   Possible pregnancy, if applicable.   Previous problems with anesthetics or Novocaine.   History of blood clots.   History of bleeding or blood problems.   Previous surgery, particularly of the neck and/or back   Other health problems.  RISKS AND COMPLICATIONS These are very uncommon but include:  Bleeding.   Injury to a nerve near the injection site.   Weakness or numbness in areas controlled by nerves near the injection site.   Infection.   Pain at the site of the injection.   Temporary fluid retention in those who are prone to this problem.   Allergic reaction to anesthetics or medicines used during the procedure.  Diabetics may have a temporary increase in their blood sugar after any surgical procedure, especially if steroids are used. Stinging/burning of the numbing medicine is the most uncomfortable part of the procedure; however every person's response to any procedure is individual.  BEFORE THE PROCEDURE   Your caregiver will provide instructions about stopping any medication before the procedure.   Unless advised otherwise, if the injections are in your neck, you may take your medications as usual with a sip of water but do not eat or drink for 6 hours before the procedure.   Unless advised otherwise, you may eat, drink and take your medications as   usual on the day of the procedure (both before and after) if the injections are to be in your lower back.   There is no other specific preparation necessary unless advised otherwise.  PROCEDURE After checking your blood pressure, the procedure will be done in the x-ray (fluoroscopy) room while lying on your stomach. For procedures in the neck, an intravenous line is usually started. The back is then cleansed with an antiseptic  soap. Sterile drapes are placed in this area. The skin is numbed with a local anesthetic. This is felt as a stinging or burning sensation. Using x-ray guidance, needles are then advanced to the appropriate locations. Once the needles are in the proper location, the anesthetic and steroid is injected through the needles and the needles are removed. The skin is then cleansed and bandages are applied. Blood pressure will be checked again, and you will be discharged to leave with your ride after your caregiver says it is okay to go.  AFTER THE PROCEDURE  You may not drive for the remainder of the day after your procedure. An adult must be present to drive you home or to go with you in a taxi or on public transportation. The procedure will be canceled if you do not have a responsible adult with you! This is for your safety.  HOME CARE INSTRUCTIONS   The bandages noted above can be removed on the morning after the procedure.   Resume medications according to your caregiver's instructions.   No heat is to be used near or over the injected area(s) for the remainder of the day.   No tub bath or soaking in water (such as a pool, jacuzzi, etc.) for the remainder of the day.   Some local tenderness may be experienced for a couple of days after the injection. Using an ice pack three or four times a day will help this.   Keep track of the amount of pain relief as well as how long the pain relief lasted.  SEEK MEDICAL CARE IF:   There is drainage from the injection site.   Pain is not controlled with medications prescribed.   There is significant bleeding or swelling.  SEEK IMMEDIATE MEDICAL CARE IF:   You develop a fever of 101 F (38.3 C) or greater.   Worsening pain, swelling, and/or red streaking develops in the skin around the injection site.   Severe pain develops and cannot be controlled with medications prescribed.   You develop any headache, stiff neck, nausea, vomiting, or your eyes become  very sensitive to light.   Weakness or paralysis develops in arms or legs not present before the procedure.   You develop difficulty urinating or difficulty breathing.  Document Released: 10/15/2006 Document Revised: 05/15/2011 Document Reviewed: 10/05/2008 [employer] Patient Information 2012 [employer]. 

## 2011-09-04 NOTE — Progress Notes (Signed)

## 2011-09-08 ENCOUNTER — Encounter: Payer: Self-pay | Admitting: Physical Medicine & Rehabilitation

## 2011-09-27 ENCOUNTER — Ambulatory Visit (INDEPENDENT_AMBULATORY_CARE_PROVIDER_SITE_OTHER): Payer: Medicare Other | Admitting: Family Medicine

## 2011-09-27 VITALS — BP 99/59 | HR 63 | Temp 98.0°F | Resp 16

## 2011-09-27 DIAGNOSIS — T07XXXA Unspecified multiple injuries, initial encounter: Secondary | ICD-10-CM

## 2011-09-27 DIAGNOSIS — Z23 Encounter for immunization: Secondary | ICD-10-CM

## 2011-09-27 DIAGNOSIS — T148XXA Other injury of unspecified body region, initial encounter: Secondary | ICD-10-CM

## 2011-09-27 MED ORDER — TETANUS-DIPHTH-ACELL PERTUSSIS 5-2.5-18.5 LF-MCG/0.5 IM SUSP
0.5000 mL | Freq: Once | INTRAMUSCULAR | Status: AC
  Administered 2011-09-27: 0.5 mL via INTRAMUSCULAR

## 2011-09-27 MED ORDER — CEPHALEXIN 500 MG PO CAPS: 500.0000 mg | ORAL_CAPSULE | Freq: Three times a day (TID) | ORAL | Status: AC

## 2011-09-27 NOTE — Progress Notes (Signed)
Verbal consent obtained from the patient and his daughter.  Local anesthesia with 3cc Lidocaine 2% without epinephrine.  Wound scrubbed with soap and water and rinsed.  Wound closed with 1 5-0 Ethilon horizontal mattress sutures.  Wound cleansed and dressed.  Skin tears cleaned with soap and water and steri strips applied.  Dressed.

## 2011-09-27 NOTE — Patient Instructions (Signed)
Keep wound clean as possible. If any concern of infection, come in sooner. Return in one week.  Keflex for infection precaution.  WOUND CARE Please return in 7-10 days to have your stitches/staples removed or sooner if you have concerns. Marland Kitchen Keep area clean and dry for 24 hours. Do not remove bandage, if applied. . After 24 hours, remove bandage and wash wound gently with mild soap and warm water. Reapply a new bandage after cleaning wound, if directed. . Continue daily cleansing with soap and water until stitches/staples are removed. . Do not apply any ointments or creams to the wound while stitches/staples are in place, as this may cause delayed healing. . Notify the office if you experience any of the following signs of infection: Swelling, redness, pus drainage, streaking, fever >101.0 F . Notify the office if you experience excessive bleeding that does not stop after 15-20 minutes of constant, firm pressure.

## 2011-09-27 NOTE — Progress Notes (Signed)
Subjective: Patient fell on the sidewalk he suffered multiple abrasions. He is on medicines for his heart which can cause the bleb to be thinner) aspirin (he has multiple abrasions on his right hand his left arm and elbow and left knee.  Tetanus was at least 4 yrs ago by our record unless his pcp gave one.  Objective: superficial abrasion right hand fifth digit. Moderately large skin tear left elbow in a V. fashion with each border of the tear being approximately 3-4 cm. And medial to the big tear was a puncture wound that was bleeding. A little lower than the big tear while on the back of the elbow were 4 other small wounds and skin tears are, up to 1-2 cm in length. He has a superficial abrasion of his left knee. He had no loss of consciousness, simply a fall.  Assessment: Multiple abrasions and large skin tear on left elbow and deeper wound above that, 1 CM.  Plan:  PA Tinnie Gens will do repairs.  Wounds look very nice after being cleaned up. Most were Steri-Stripped and the one was sutured. TDAP 1wk.

## 2011-09-28 ENCOUNTER — Ambulatory Visit: Payer: Medicare Other

## 2011-09-28 ENCOUNTER — Ambulatory Visit (INDEPENDENT_AMBULATORY_CARE_PROVIDER_SITE_OTHER): Payer: Medicare Other | Admitting: Family Medicine

## 2011-09-28 VITALS — BP 110/83 | HR 57 | Temp 97.5°F | Resp 18 | Ht 72.5 in | Wt 183.4 lb

## 2011-09-28 DIAGNOSIS — R0789 Other chest pain: Secondary | ICD-10-CM

## 2011-09-28 DIAGNOSIS — R071 Chest pain on breathing: Secondary | ICD-10-CM

## 2011-09-28 DIAGNOSIS — T148XXA Other injury of unspecified body region, initial encounter: Secondary | ICD-10-CM

## 2011-09-28 DIAGNOSIS — T07XXXA Unspecified multiple injuries, initial encounter: Secondary | ICD-10-CM

## 2011-09-28 NOTE — Progress Notes (Signed)
Subjective Patient is here for recheck from yesterday. He is hurting in his left chest wall when he breathes.  Objective: Few crackles at both bases, both are minimal. Left chest wall is tender behind the breast. Wounds were looked at and all seem to be intact even though they have bled lower the night.  Assessment: Multiple wounds or abrasions Chest wall pain secondary to fall  Plan: Left rib x-rays  [facility] reading (PRIMARY) by  Dr. Alwyn Ren No rib fracture seen.  Lung clear. Reassurance. Let us know if he has further problems.Derrick Andrews

## 2011-10-02 ENCOUNTER — Ambulatory Visit (INDEPENDENT_AMBULATORY_CARE_PROVIDER_SITE_OTHER): Payer: Medicare Other | Admitting: Family Medicine

## 2011-10-02 VITALS — BP 117/52 | HR 70 | Temp 97.4°F | Resp 16 | Ht 72.5 in | Wt 183.0 lb

## 2011-10-02 DIAGNOSIS — Z5189 Encounter for other specified aftercare: Secondary | ICD-10-CM

## 2011-10-02 DIAGNOSIS — IMO0002 Reserved for concepts with insufficient information to code with codable children: Secondary | ICD-10-CM

## 2011-10-02 DIAGNOSIS — T148XXA Other injury of unspecified body region, initial encounter: Secondary | ICD-10-CM

## 2011-10-02 NOTE — Progress Notes (Signed)
[facility]:  Office Visit  Chief Complaint:  Chief Complaint  Patient presents with  . Suture / Staple Removal    HPI: Derrick Andrews is a 78 y.o. male who complains of  Left antecubital wounds. Here for suture removal.   No past medical history on file. No past surgical history on file. History   Social History  . Marital Status: Widowed    Spouse Name: N/A    Number of Children: N/A  . Years of Education: N/A   Social History Main Topics  . Smoking status: Former Games developer  . Smokeless tobacco: Never Used  . Alcohol Use: Yes     occasional  . Drug Use: No  . Sexually Active: None   Other Topics Concern  . None   Social History Narrative  . None   No family history on file. No Known Allergies Prior to Admission medications   Medication Sig Start Date End Date Taking? Authorizing Provider  alendronate (FOSAMAX) 70 MG tablet Take 70 mg by mouth every 7 (seven) days. Take with a full glass of water on an empty stomach.    Historical Provider, MD  aspirin 81 MG tablet Take 81 mg by mouth daily.    Historical Provider, MD  atenolol (TENORMIN) 25 MG tablet Take 25 mg by mouth daily.    Historical Provider, MD  cephALEXin (KEFLEX) 500 MG capsule Take 1 capsule (500 mg total) by mouth 3 (three) times daily. 09/27/11 10/07/11  Peyton Najjar, MD  pravastatin (PRAVACHOL) 40 MG tablet Take 40 mg by mouth daily.    Historical Provider, MD  predniSONE (DELTASONE) 1 MG tablet Take 1 mg by mouth daily.    Historical Provider, MD     ROS: The patient denies fevers, chills, night sweats, unintentional weight loss, chest pain, palpitations, wheezing, dyspnea on exertion, nausea, vomiting, abdominal pain, dysuria, hematuria, melena, numbness, weakness, or tingling.   All other systems have been reviewed and were otherwise negative with the exception of those mentioned in the HPI and as above.    PHYSICAL EXAM: Filed Vitals:   10/02/11 1118  BP: 117/52  Pulse: 70    Temp: 97.4 F (36.3 C)  Resp: 16   Filed Vitals:   10/02/11 1118  Height: 6' 0.5" (1.842 m)  Weight: 183 lb (83.008 kg)   Body mass index is 24.48 kg/(m^2).  General: Alert, no acute distress HEENT:  Normocephalic, atraumatic, oropharynx patent.  Cardiovascular:  Regular rate and rhythm, no rubs murmurs or gallops.  No Carotid bruits, radial pulse intact. No pedal edema.  Respiratory: Clear to auscultation bilaterally.  No wheezes, rales, or rhonchi.  No cyanosis, no use of accessory musculature GI: No organomegaly, abdomen is soft and non-tender, positive bowel sounds.  No masses. Skin: No rashes. Wound is healing well. Dry clean, good approximation , no signs of infection Neurologic: Facial musculature symmetric. Psychiatric: Patient is appropriate throughout our interaction. Lymphatic: No cervical lymphadenopathy Musculoskeletal: Gait intact.   LABS: Results for orders placed in visit on 02/25/11  SURGICAL PCR SCREEN      Component Value Range   MRSA, PCR NEGATIVE  NEGATIVE    Staphylococcus aureus NEGATIVE  NEGATIVE      EKG/XRAY:   Primary read interpreted by Dr. Conley Rolls at [facility].   ASSESSMENT/PLAN: Encounter Diagnoses  Name Primary?  . Dressing change/suture removal Yes  . Visit for wound check     Wound is healing well. Dry, clean, no signs of infection.  Suture x 1 removed without complications. Asked patient to continue with wound care as directed and to leave steri strips and keep dry for another 3 days and if they fall loff by themselves then that would be best.  F/u prn   Caterine Mcmeans PHUONG, DO 10/02/2011 5:09 PM

## 2011-10-06 ENCOUNTER — Encounter: Payer: Medicare Other | Attending: Physical Medicine & Rehabilitation

## 2011-10-06 ENCOUNTER — Ambulatory Visit (HOSPITAL_BASED_OUTPATIENT_CLINIC_OR_DEPARTMENT_OTHER): Payer: Medicare Other | Admitting: Physical Medicine & Rehabilitation

## 2011-10-06 ENCOUNTER — Encounter: Payer: Self-pay | Admitting: Physical Medicine & Rehabilitation

## 2011-10-06 VITALS — BP 121/56 | HR 66 | Resp 14 | Ht 73.0 in | Wt 189.0 lb

## 2011-10-06 DIAGNOSIS — M48061 Spinal stenosis, lumbar region without neurogenic claudication: Secondary | ICD-10-CM | POA: Insufficient documentation

## 2011-10-06 DIAGNOSIS — M47817 Spondylosis without myelopathy or radiculopathy, lumbosacral region: Secondary | ICD-10-CM | POA: Insufficient documentation

## 2011-10-06 DIAGNOSIS — Z79899 Other long term (current) drug therapy: Secondary | ICD-10-CM | POA: Insufficient documentation

## 2011-10-06 DIAGNOSIS — M545 Low back pain, unspecified: Secondary | ICD-10-CM | POA: Insufficient documentation

## 2011-10-06 DIAGNOSIS — M5126 Other intervertebral disc displacement, lumbar region: Secondary | ICD-10-CM | POA: Insufficient documentation

## 2011-10-06 DIAGNOSIS — Z7982 Long term (current) use of aspirin: Secondary | ICD-10-CM | POA: Insufficient documentation

## 2011-10-06 NOTE — Progress Notes (Signed)
  PROCEDURE RECORD [facility]   Name: SCOUT GUMBS DOB:1917-08-03 MRN: [id number]  Date:10/06/2011  Physician: Claudette Laws, MD    Nurse/CMA: Redgie Grayer  Allergies: No Known Allergies  Consent Signed: yes  Is patient diabetic? no  Pregnant: no LMP: No LMP for male patient. (age [age])  Anticoagulants: no Anti-inflammatory: no Antibiotics: no  Procedure: Medial Branch Block  Position: Prone Start Time: 1:03pm  End Time: 1:13pm  Fluoro Time: 33 seconds  RN/CMA Carroll,CMA Carroll,CMA    Time 12:37pm 1:16pm    BP 121/56 117/46    Pulse 66 69    Respirations 14 14    O2 Sat 94% 96%    S/S 6 6    Pain Level 0/10 0/10     D/C home with Jenna Luo (friend), patient A & O X 3, D/C instructions reviewed, and sits independently.

## 2011-10-06 NOTE — Progress Notes (Signed)
  Subjective:    Patient ID: Derrick Andrews, male    DOB: 1918/02/27, 78 y.o.   MRN: [id number]  HPI Pain Inventory Average Pain 8 Pain Right Now 8 My pain is stabbing  In the last 24 hours, has pain interfered with the following? General activity 8 Relation with others 8 Enjoyment of life 7 What TIME of day is your pain at its worst? evening Sleep (in general) Good  Pain is worse with: walking and standing Pain improves with: rest Relief from Meds: N/A  Mobility how many minutes can you walk? 10 ability to climb steps?  yes do you drive?  yes  Function retired  Neuro/Psych No problems in this area  Prior Studies Any changes since last visit?  no  Physicians involved in your care Any changes since last visit?  no  Review of Systems  Constitutional: Negative.   HENT: Negative.   Eyes: Negative.   Respiratory: Negative.   Cardiovascular: Negative.   Gastrointestinal: Negative.   Genitourinary: Negative.   Musculoskeletal: Negative.   Skin: Negative.   Neurological: Negative.   Hematological: Negative.   Psychiatric/Behavioral: Negative.        Objective:   Physical Exam        Assessment & Plan:

## 2011-10-06 NOTE — Progress Notes (Signed)

## 2011-10-06 NOTE — Patient Instructions (Signed)
Please keep track of your pain. If more than half of your pain goes away even short-term, there is another procedure called radiofrequency neurotomy that we can do next visit. You'll need to have a driver next visit.

## 2011-11-04 ENCOUNTER — Ambulatory Visit (HOSPITAL_BASED_OUTPATIENT_CLINIC_OR_DEPARTMENT_OTHER): Payer: Medicare Other | Admitting: Physical Medicine & Rehabilitation

## 2011-11-04 ENCOUNTER — Encounter: Payer: Self-pay | Admitting: Physical Medicine & Rehabilitation

## 2011-11-04 ENCOUNTER — Encounter: Payer: Medicare Other | Attending: Physical Medicine & Rehabilitation

## 2011-11-04 VITALS — BP 111/53 | HR 64 | Resp 14 | Ht 72.0 in | Wt 184.0 lb

## 2011-11-04 DIAGNOSIS — M545 Low back pain, unspecified: Secondary | ICD-10-CM | POA: Insufficient documentation

## 2011-11-04 DIAGNOSIS — M5126 Other intervertebral disc displacement, lumbar region: Secondary | ICD-10-CM | POA: Insufficient documentation

## 2011-11-04 DIAGNOSIS — Z7982 Long term (current) use of aspirin: Secondary | ICD-10-CM | POA: Insufficient documentation

## 2011-11-04 DIAGNOSIS — M47817 Spondylosis without myelopathy or radiculopathy, lumbosacral region: Secondary | ICD-10-CM

## 2011-11-04 DIAGNOSIS — M48061 Spinal stenosis, lumbar region without neurogenic claudication: Secondary | ICD-10-CM | POA: Insufficient documentation

## 2011-11-04 DIAGNOSIS — Z79899 Other long term (current) drug therapy: Secondary | ICD-10-CM | POA: Insufficient documentation

## 2011-11-04 NOTE — Progress Notes (Signed)
  PROCEDURE RECORD [facility]   Name: Derrick Andrews DOB:[birth year] MRN: [id number]  Date:11/04/2011  Physician: Claudette Laws, MD    Nurse/CMA: Redgie Grayer  Allergies: No Known Allergies  Consent Signed: yes  Is patient diabetic? no   Pregnant: no LMP: No LMP for male patient. (age [age])  Anticoagulants: yes (ASA 81mg ) Anti-inflammatory: no Antibiotics: no  Procedure: Lumbar Radiofrequency  Position: Prone Start Time: 10:45am  End Time: 11:15am  Fluoro Time: 38 seconds  RN/CMA Carroll,CMA Carroll,CMA    Time 10:09am 11:19am    BP 111/53 112/44    Pulse 64 64    Respirations 14 16    O2 Sat 97% 98%    S/S 6 6    Pain Level 0/10 0/10     D/C home with Will (friend), patient A & O X 3, D/C instructions reviewed, and sits independently.

## 2011-11-04 NOTE — Progress Notes (Signed)
RightL5 dorsal ramus., Right L4 and Right L3 medial branch radio frequency neuropathy under fluoroscopic guidance   Indication: Low back pain due to lumbar spondylosis which has been relieved on 2 occasions by greater than 50% by lumbar medial branch blocks at corresponding levels.  Informed consent was obtained after describing risks and benefits of the procedure with the patient, this includes bleeding, bruising, infection, paralysis and medication side effects. The patient wishes to proceed and has given written consent. The patient was placed in a prone position. The lumbar and sacral area was marked and prepped with Betadine. A 25-gauge 1-1/2 inch needle was inserted into the skin and subcutaneous tissue at 3 sites in one ML of 1% lidocaine was injected into each site. Then a 20-gauge 10 cm radio frequency needle with a 1 cm curved active tip was inserted targeting the Right S1 SAP/sacral ala junction. Bone contact was made and confirmed with lateral imaging. Sensory stimulation at 50 Hz followed by motor stimulation at 2 Hz confirm proper needle location followed by injection of one ML of the solution containing one ML of 4 mg per mL dexamethasone and 3 mL of 1% MPF lidocaine. Then the Right L5 SAP/transverse process junction was targeted. Bone contact was made and confirmed with lateral imaging. Sensory stimulation at 50 Hz followed by motor stimulation at 2 Hz confirm proper needle location followed by injection of one ML of the solution containing one ML of 4 mg per mL dexamethasone and 3 mL of 1% MPF lidocaine. Then the Right L4 SAP/transverse process junction was targeted. Bone contact was made and confirmed with lateral imaging. Sensory stimulation at 50 Hz followed by motor stimulation at 2 Hz confirm proper needle location followed by injection of one ML of the solution containing one ML of 4 mg per mL dexamethasone and 3 mL of 1% MPF lidocaine. Radio frequency lesion being at 80C for 90 seconds  was performed. Needles were removed. Post procedure instructions and vital signs were performed. Patient tolerated procedure well. Followup appointment was given. 

## 2011-11-04 NOTE — Patient Instructions (Signed)
We will do the same procedure as this month next month on the left side.

## 2011-12-04 ENCOUNTER — Encounter: Payer: Medicare Other | Attending: Physical Medicine & Rehabilitation

## 2011-12-04 ENCOUNTER — Encounter: Payer: Self-pay | Admitting: Physical Medicine & Rehabilitation

## 2011-12-04 ENCOUNTER — Ambulatory Visit (HOSPITAL_BASED_OUTPATIENT_CLINIC_OR_DEPARTMENT_OTHER): Payer: Medicare Other | Admitting: Physical Medicine & Rehabilitation

## 2011-12-04 VITALS — BP 131/44 | HR 57 | Resp 16 | Ht 73.5 in | Wt 185.0 lb

## 2011-12-04 DIAGNOSIS — M5126 Other intervertebral disc displacement, lumbar region: Secondary | ICD-10-CM | POA: Insufficient documentation

## 2011-12-04 DIAGNOSIS — M545 Low back pain, unspecified: Secondary | ICD-10-CM | POA: Insufficient documentation

## 2011-12-04 DIAGNOSIS — M48061 Spinal stenosis, lumbar region without neurogenic claudication: Secondary | ICD-10-CM | POA: Insufficient documentation

## 2011-12-04 DIAGNOSIS — Z79899 Other long term (current) drug therapy: Secondary | ICD-10-CM | POA: Insufficient documentation

## 2011-12-04 DIAGNOSIS — Z7982 Long term (current) use of aspirin: Secondary | ICD-10-CM | POA: Insufficient documentation

## 2011-12-04 DIAGNOSIS — M47817 Spondylosis without myelopathy or radiculopathy, lumbosacral region: Secondary | ICD-10-CM | POA: Insufficient documentation

## 2011-12-04 NOTE — Progress Notes (Signed)
  PROCEDURE RECORD [facility]   Name: Derrick Andrews DOB:1918/02/16 MRN: [id number]  Date:12/04/2011  Physician: Claudette Laws, MD    Nurse/CMA: Kelli Churn, CMA  Allergies: No Known Allergies  Consent Signed: yes  Is patient diabetic? no   Pregnant: no LMP: No LMP for male patient. (age [age])  Anticoagulants: yes (ASA) Anti-inflammatory: no Antibiotics: no  Procedure: Lumbar Radiofrequency  Position: Prone Start Time: 10:25am End Time:10:50 am  Fluoro Time:32  RN/CMA Noralyn Pick, CMA Levens,CMA    Time 9:44am 10:58 am    BP 131/44 135/46    Pulse 57 59    Respirations 16 16    O2 Sat 96% 94    S/S 6 6    Pain Level 0/10 0/10     D/C home with Vertell Limber (daughter), patient A & O X 3, D/C instructions reviewed, and sits independently.

## 2011-12-04 NOTE — Progress Notes (Signed)
Left L5 dorsal ramus., left L4 and left L3 medial branch radio frequency neuropathy under fluoroscopic guidance  Indication: Low back pain due to lumbar spondylosis which has been relieved on 2 occasions by greater than 50% by lumbar medial branch blocks at corresponding levels.  Informed consent was obtained after describing risks and benefits of the procedure with the patient, this includes bleeding, bruising, infection, paralysis and medication side effects. The patient wishes to proceed and has given written consent. The patient was placed in a prone position. The lumbar and sacral area was marked and prepped with Betadine. A 25-gauge 1-1/2 inch needle was inserted into the skin and subcutaneous tissue at 3 sites in one ML of 1% lidocaine was injected into each site. Then a 20-gauge 10 cm radio frequency needle with a 1 cm curved active tip was inserted targeting the left S1 SAP/sacral ala junction. Bone contact was made and confirmed with lateral imaging. Sensory stimulation at 50 Hz followed by motor stimulation at 2 Hz confirm proper needle location followed by injection of one ML of the solution containing one ML of 4 mg per mL dexamethasone and 3 mL of 1% MPF lidocaine. Then the left L5 SAP/transverse process junction was targeted. Bone contact was made and confirmed with lateral imaging. Sensory stimulation at 50 Hz followed by motor stimulation at 2 Hz confirm proper needle location followed by injection of one ML of the solution containing one ML of 4 mg per mL dexamethasone and 3 mL of 1% MPF lidocaine. Then the left L4 SAP/transverse process junction was targeted. Bone contact was made and confirmed with lateral imaging. Sensory stimulation at 50 Hz followed by motor stimulation at 2 Hz confirm proper needle location followed by injection of one ML of the solution containing one ML of 4 mg per mL dexamethasone and 3 mL of 1% MPF lidocaine. Radio frequency lesion being at 80C for 90 seconds was  performed. Needles were removed. Post procedure instructions and vital signs were performed. Patient tolerated procedure well. Followup appointment was given.  

## 2011-12-04 NOTE — Patient Instructions (Signed)
Radiofrequency Lesioning Care After Refer to this sheet in the next few weeks. These instructions provide you with information on caring for yourself after your procedure. Your caregiver may also give you more specific instructions. Your treatment has been planned according to current medical practices, but problems sometimes occur. Call your caregiver if you have any problems or questions after your procedure. HOME CARE INSTRUCTIONS  Take pain medicine as directed by your caregiver.   You may have pain from the burned nerve for a while after the procedure. This takes time to heal. Ask your caregiver how long you should expect pain.   You should be able to return to your normal activities a couple of days after the procedure. When you can go back to work will depend on the type of work you do. Discuss this with your caregiver.   Pay close attention to how you feel after the procedure. If you start having pain, write down when it hurts and how it feels. This will help you and your caregiver know if you need another treatment.  SEEK MEDICAL CARE IF:  The site where the needle was inserted for the procedure becomes red, swollen, or tender to touch.   Your pain does not get better.  SEEK IMMEDIATE MEDICAL CARE IF:  You develop sudden, severe pain.   You develop numbness or tingling near the procedure site.   You have a fever.  MAKE SURE YOU:  Understand these instructions.   Will watch your condition.   Will get help right away if you are not doing well or get worse.  Document Released: 01/22/2011 Document Revised: 05/15/2011 Document Reviewed: 01/22/2011 [facility] Patient Information 2012 [place], [place].

## 2012-01-02 ENCOUNTER — Ambulatory Visit (HOSPITAL_BASED_OUTPATIENT_CLINIC_OR_DEPARTMENT_OTHER): Payer: Medicare Other | Admitting: Physical Medicine & Rehabilitation

## 2012-01-02 ENCOUNTER — Encounter: Payer: Medicare Other | Attending: Physical Medicine & Rehabilitation

## 2012-01-02 ENCOUNTER — Encounter: Payer: Self-pay | Admitting: Physical Medicine & Rehabilitation

## 2012-01-02 VITALS — BP 111/60 | HR 55 | Resp 16 | Ht 73.0 in | Wt 185.6 lb

## 2012-01-02 DIAGNOSIS — Z7982 Long term (current) use of aspirin: Secondary | ICD-10-CM | POA: Insufficient documentation

## 2012-01-02 DIAGNOSIS — M48061 Spinal stenosis, lumbar region without neurogenic claudication: Secondary | ICD-10-CM | POA: Insufficient documentation

## 2012-01-02 DIAGNOSIS — M545 Low back pain, unspecified: Secondary | ICD-10-CM | POA: Insufficient documentation

## 2012-01-02 DIAGNOSIS — M5126 Other intervertebral disc displacement, lumbar region: Secondary | ICD-10-CM | POA: Insufficient documentation

## 2012-01-02 DIAGNOSIS — M47817 Spondylosis without myelopathy or radiculopathy, lumbosacral region: Secondary | ICD-10-CM | POA: Insufficient documentation

## 2012-01-02 DIAGNOSIS — Z79899 Other long term (current) drug therapy: Secondary | ICD-10-CM | POA: Insufficient documentation

## 2012-01-02 NOTE — Progress Notes (Signed)
  Subjective:    Patient ID: Derrick Andrews, male    DOB: 1917-08-06, 78 y.o.   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 Continues to have low back pain. He is walking more bent over. Has not had any recent physical therapy. No recent MRI scans. Pain Inventory Average Pain 8  Only when walking and standing Pain Right Now 0 My pain is dull  In the last 24 hours, has pain interfered with the following? General activity 6 Relation with others 9 Enjoyment of life 7 What TIME of day is your pain at its worst? daytime Sleep (in general) Good  Pain is worse with: walking and standing Pain improves with: sitting or lying down Relief from Meds: not taking pain medication  Mobility walk without assistance how many minutes can you walk? 7 ability to climb steps?  yes do you drive?  yes  Function retired  Neuro/Psych trouble walking (balance)  Prior Studies Any changes since last visit?  no  Physicians involved in your care Any changes since last visit?  no   History reviewed. No pertinent family history. History   Social History  . Marital Status: Widowed    Spouse Name: N/A    Number of Children: N/A  . Years of Education: N/A   Social History Main Topics  . Smoking status: Former Games developer  . Smokeless tobacco: Never Used  . Alcohol Use: Yes     occasional  . Drug Use: No  . Sexually Active: None   Other Topics Concern  . None   Social History Narrative  . None   Past Surgical History  Procedure Date  . Vericose vein stripping in 1950s    Past Medical History  Diagnosis Date  . Hypertension   . Hyperlipidemia    BP 111/60  Pulse 55  Resp 16  Ht 6\' 1"  (1.854 m)  Wt 185 lb 9.6 oz (84.188 kg)  BMI 24.49 kg/m2  SpO2 94%   Review of Systems  All other systems reviewed and are negative.         Objective:   Physical Exam  Constitutional: He is oriented to person, place, and time. He appears well-developed and well-nourished.  Eyes: Conjunctivae and EOM are normal. Pupils are equal, round, and reactive to light.  Musculoskeletal:       Lumbar back: He exhibits decreased range of motion, tenderness, deformity and pain. He exhibits no swelling, no edema and no spasm.       -SLR Limited extension > Flexion Pain in Lumbosacral area   Neurological: He is alert and oriented to person, place, and time. He has normal reflexes.          Assessment & Plan:  1.  Lumbar and sacral pain. More on the left side. No significant improvement after radiofrequency neurotomy. Had short-term relief with the medial branch block component of the procedure. Will refer to physical therapy for strengthening and then progressed to community exercise program I'll see the patient back in one month. If he is not any better would consider ordering an MRI.

## 2012-01-02 NOTE — Patient Instructions (Signed)
You will go to physical therapy at [facility] You'll see me in one month If you are not any better will order a lumbar MRI

## 2012-01-27 ENCOUNTER — Ambulatory Visit: Payer: Medicare Other | Attending: Physical Medicine & Rehabilitation | Admitting: Physical Therapy

## 2012-01-27 DIAGNOSIS — IMO0001 Reserved for inherently not codable concepts without codable children: Secondary | ICD-10-CM | POA: Insufficient documentation

## 2012-01-27 DIAGNOSIS — R269 Unspecified abnormalities of gait and mobility: Secondary | ICD-10-CM | POA: Insufficient documentation

## 2012-01-27 DIAGNOSIS — M545 Low back pain, unspecified: Secondary | ICD-10-CM | POA: Insufficient documentation

## 2012-01-29 ENCOUNTER — Ambulatory Visit: Payer: Medicare Other

## 2012-02-03 ENCOUNTER — Ambulatory Visit: Payer: Medicare Other | Admitting: Physical Therapy

## 2012-02-03 ENCOUNTER — Ambulatory Visit (HOSPITAL_BASED_OUTPATIENT_CLINIC_OR_DEPARTMENT_OTHER): Payer: Medicare Other | Admitting: Physical Medicine & Rehabilitation

## 2012-02-03 ENCOUNTER — Encounter: Payer: Self-pay | Admitting: Physical Medicine & Rehabilitation

## 2012-02-03 ENCOUNTER — Encounter: Payer: Medicare Other | Attending: Physical Medicine & Rehabilitation

## 2012-02-03 VITALS — BP 112/59 | HR 67 | Resp 14 | Ht 73.0 in | Wt 186.6 lb

## 2012-02-03 DIAGNOSIS — M47817 Spondylosis without myelopathy or radiculopathy, lumbosacral region: Secondary | ICD-10-CM | POA: Insufficient documentation

## 2012-02-03 DIAGNOSIS — M48061 Spinal stenosis, lumbar region without neurogenic claudication: Secondary | ICD-10-CM | POA: Insufficient documentation

## 2012-02-03 DIAGNOSIS — Z79899 Other long term (current) drug therapy: Secondary | ICD-10-CM | POA: Insufficient documentation

## 2012-02-03 DIAGNOSIS — M545 Low back pain, unspecified: Secondary | ICD-10-CM | POA: Insufficient documentation

## 2012-02-03 DIAGNOSIS — Z7982 Long term (current) use of aspirin: Secondary | ICD-10-CM | POA: Insufficient documentation

## 2012-02-03 DIAGNOSIS — M5126 Other intervertebral disc displacement, lumbar region: Secondary | ICD-10-CM | POA: Insufficient documentation

## 2012-02-03 NOTE — Progress Notes (Signed)
  Subjective:    Patient ID: Derrick Andrews, male    DOB: [birth year], 78 y.o.   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 Continues to have low back pain. He is walking more bent over  Improving after 2 PT treatments Pain Inventory Average Pain 8 Pain Right Now 8 My pain is intermittent  In the last 24 hours, has pain interfered with the following? General activity 8 Relation with others 8 Enjoyment of life 8 What TIME of day is your pain at its worst? daytime Sleep (in general) Good  Pain is worse with: walking and standing Pain improves with: pain only when he stands and walk Relief from Meds: 0  Mobility walk without assistance how many minutes can you walk? 5-10 ability to climb steps?  yes do you drive?  yes  Function retired  Neuro/Psych bladder control problems trouble walking  Prior Studies Any changes since last visit?  no has had 2 physical therapy sessions  Physicians involved in your care Any changes since last visit?  no   History reviewed. No pertinent family history. History   Social History  . Marital Status: Widowed    Spouse Name: N/A    Number of Children: N/A  . Years of Education: N/A   Social History Main Topics  . Smoking status: Former Games developer  . Smokeless tobacco: Never Used  . Alcohol Use: Yes     occasional  . Drug Use: No  . Sexually Active: None   Other Topics Concern  . None   Social History Narrative  . None   Past Surgical History  Procedure Date  . Varicose veing stripping in 1950s    Past Medical History  Diagnosis Date  . Hypertension   . Hyperlipidemia    BP 112/59  Pulse 67  Resp 14  Ht 6\' 1"  (1.854 m)  Wt 186 lb 9.6 oz (84.641 kg)  BMI 24.62 kg/m2  SpO2 92%   Review of Systems  Genitourinary: Positive for difficulty urinating.  Musculoskeletal:  Positive for gait problem.  All other systems reviewed and are negative.       Objective:   Physical Exam  Constitutional: He is oriented to person, place, and time. He appears well-developed and well-nourished.  Eyes: Conjunctivae and EOM are normal. No vision on L eyeMusculoskeletal:  Lumbar back: He exhibits decreased range of motion, tenderness, deformity and pain. He exhibits no swelling, no edema and no spasm.  -SLR Limited extension > Flexion Pain in Lumbosacral area  Neurological: He is alert and oriented to person, place, and time. He has normal reflexes.           Assessment & Plan:  1. Lumbar and sacral pain. More on the left side. No significant improvement after radiofrequency neurotomy. Had short-term relief with the medial branch block component of the procedure Cont PT , Cont HEP RTC 1 month No MRI needed at this point

## 2012-02-03 NOTE — Patient Instructions (Signed)
Cont PT Continue your home exercise program I'll see you in one month

## 2012-02-05 ENCOUNTER — Ambulatory Visit: Payer: Medicare Other | Admitting: Physical Therapy

## 2012-02-10 ENCOUNTER — Ambulatory Visit: Payer: Medicare Other | Admitting: Physical Therapy

## 2012-02-12 ENCOUNTER — Ambulatory Visit: Payer: Medicare Other | Admitting: Physical Therapy

## 2012-02-17 ENCOUNTER — Ambulatory Visit: Payer: Medicare Other | Attending: Physical Medicine & Rehabilitation | Admitting: Physical Therapy

## 2012-02-17 DIAGNOSIS — M545 Low back pain, unspecified: Secondary | ICD-10-CM | POA: Insufficient documentation

## 2012-02-17 DIAGNOSIS — IMO0001 Reserved for inherently not codable concepts without codable children: Secondary | ICD-10-CM | POA: Insufficient documentation

## 2012-02-17 DIAGNOSIS — R269 Unspecified abnormalities of gait and mobility: Secondary | ICD-10-CM | POA: Insufficient documentation

## 2012-02-19 ENCOUNTER — Ambulatory Visit: Payer: Medicare Other | Admitting: Physical Therapy

## 2012-02-20 ENCOUNTER — Ambulatory Visit: Payer: Medicare Other | Admitting: Physical Therapy

## 2012-02-24 ENCOUNTER — Ambulatory Visit: Payer: Medicare Other | Admitting: Physical Therapy

## 2012-02-25 ENCOUNTER — Ambulatory Visit: Payer: Medicare Other | Admitting: Physical Therapy

## 2012-03-01 ENCOUNTER — Ambulatory Visit: Payer: Medicare Other | Admitting: Physical Therapy

## 2012-03-02 ENCOUNTER — Ambulatory Visit: Payer: Medicare Other | Admitting: Physical Medicine & Rehabilitation

## 2012-03-03 ENCOUNTER — Ambulatory Visit: Payer: Medicare Other | Admitting: Physical Therapy

## 2012-03-08 ENCOUNTER — Ambulatory Visit: Payer: Medicare Other | Admitting: Physical Therapy

## 2012-03-11 ENCOUNTER — Ambulatory Visit: Payer: Medicare Other | Attending: Physical Medicine & Rehabilitation | Admitting: Physical Therapy

## 2012-03-11 DIAGNOSIS — IMO0001 Reserved for inherently not codable concepts without codable children: Secondary | ICD-10-CM | POA: Insufficient documentation

## 2012-03-11 DIAGNOSIS — M545 Low back pain, unspecified: Secondary | ICD-10-CM | POA: Insufficient documentation

## 2012-03-11 DIAGNOSIS — R269 Unspecified abnormalities of gait and mobility: Secondary | ICD-10-CM | POA: Insufficient documentation

## 2012-03-15 ENCOUNTER — Ambulatory Visit (HOSPITAL_BASED_OUTPATIENT_CLINIC_OR_DEPARTMENT_OTHER): Payer: Medicare Other | Admitting: Physical Medicine & Rehabilitation

## 2012-03-15 ENCOUNTER — Encounter: Payer: Medicare Other | Attending: Physical Medicine & Rehabilitation

## 2012-03-15 ENCOUNTER — Encounter: Payer: Self-pay | Admitting: Physical Medicine & Rehabilitation

## 2012-03-15 ENCOUNTER — Ambulatory Visit: Payer: Medicare Other | Admitting: Physical Medicine & Rehabilitation

## 2012-03-15 VITALS — BP 127/43 | HR 87 | Resp 16 | Ht 73.0 in | Wt 187.0 lb

## 2012-03-15 DIAGNOSIS — Z7982 Long term (current) use of aspirin: Secondary | ICD-10-CM | POA: Insufficient documentation

## 2012-03-15 DIAGNOSIS — M545 Low back pain, unspecified: Secondary | ICD-10-CM | POA: Insufficient documentation

## 2012-03-15 DIAGNOSIS — M47817 Spondylosis without myelopathy or radiculopathy, lumbosacral region: Secondary | ICD-10-CM

## 2012-03-15 DIAGNOSIS — M48061 Spinal stenosis, lumbar region without neurogenic claudication: Secondary | ICD-10-CM | POA: Insufficient documentation

## 2012-03-15 DIAGNOSIS — Z79899 Other long term (current) drug therapy: Secondary | ICD-10-CM | POA: Insufficient documentation

## 2012-03-15 DIAGNOSIS — M5126 Other intervertebral disc displacement, lumbar region: Secondary | ICD-10-CM | POA: Insufficient documentation

## 2012-03-15 NOTE — Progress Notes (Signed)
  Subjective:    Patient ID: Derrick Andrews, male    DOB: Jul 10, 1917, 78 y.o.   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 Continues to have low back pain. He is walking more bent over HPI Has 3 more physical therapy sessions left. He is not sure whether they are helpful. He would like to go back to aquatic exercise. Pain Inventory Average Pain 7 Pain Right Now 7 My pain is intermittent  In the last 24 hours, has pain interfered with the following? General activity 4 Relation with others 4 Enjoyment of life 6 What TIME of day is your pain at its worst? when moving Sleep (in general) Fair  Pain is worse with: walking, bending, standing and some activites Pain improves with: therapy/exercise, medication and injections Relief from Meds: 6  Mobility walk without assistance how many minutes can you walk? 10-15 ability to climb steps?  no do you drive?  yes Do you have any goals in this area?  yes  Function retired  Neuro/Psych No problems in this area  Prior Studies Any changes since last visit?  no  Physicians involved in your care Any changes since last visit?  no   History reviewed. No pertinent family history. History   Social History  . Marital Status: Widowed    Spouse Name: N/A    Number of Children: N/A  . Years of Education: N/A   Social History Main Topics  . Smoking status: Former Games developer  . Smokeless tobacco: Never Used  . Alcohol Use: Yes     occasional  . Drug Use: No  . Sexually Active: None   Other Topics Concern  . None   Social History Narrative  . None   Past Surgical History  Procedure Date  . Varicose veing stripping in 1950s    Past Medical History  Diagnosis Date  . Hypertension   . Hyperlipidemia    BP 127/43  Pulse 87  Resp 16  Ht 6\' 1"  (1.854 m)  Wt 187 lb (84.823 kg)  BMI 24.67  kg/m2  SpO2 77%     Review of Systems  Musculoskeletal: Positive for myalgias, back pain and arthralgias.  All other systems reviewed and are negative.       Objective:   Physical Exam  Constitutional: He is oriented to person, place, and time. He appears well-developed and well-nourished.  Eyes: Conjunctivae and EOM are normal. No vision on L eyeMusculoskeletal:  Lumbar back: He exhibits decreased range of motion, tenderness, deformity and pain. He exhibits no swelling, no edema and no spasm.  -SLR Limited extension > Flexion Pain in Lumbosacral area  Neurological: He is alert and oriented to person, place, and time. He has normal reflexes.   Gait is stenotic     Assessment & Plan:  1. Lumbar and sacral pain. More on the left side. No significant improvement after radiofrequency neurotomy. Had short-term relief with the medial branch block component of the procedure  Cont PT , Cont HEP , will resume Aquatic exercise RTC 2 month

## 2012-03-15 NOTE — Patient Instructions (Addendum)
May resume aquatic exercise such as walking in the water. May also do water aerobics please check with the instructor of the class to see what they will be doing.

## 2012-03-16 ENCOUNTER — Ambulatory Visit: Payer: Medicare Other | Admitting: Physical Therapy

## 2012-03-18 ENCOUNTER — Ambulatory Visit: Payer: Medicare Other | Admitting: Physical Therapy

## 2012-03-23 ENCOUNTER — Ambulatory Visit: Payer: Medicare Other | Admitting: Physical Therapy

## 2012-05-10 ENCOUNTER — Ambulatory Visit: Payer: Medicare Other | Admitting: Physical Medicine & Rehabilitation

## 2013-06-06 ENCOUNTER — Emergency Department (HOSPITAL_COMMUNITY): Payer: Medicare Other

## 2013-06-06 ENCOUNTER — Encounter (HOSPITAL_COMMUNITY): Payer: Self-pay | Admitting: Emergency Medicine

## 2013-06-06 ENCOUNTER — Emergency Department (HOSPITAL_COMMUNITY)
Admission: EM | Admit: 2013-06-06 | Discharge: 2013-06-06 | Disposition: A | Discharge status: Home / Self Care | Payer: Medicare Other | Attending: Emergency Medicine | Admitting: Emergency Medicine

## 2013-06-06 DIAGNOSIS — S0083XA Contusion of other part of head, initial encounter: Secondary | ICD-10-CM

## 2013-06-06 DIAGNOSIS — W010XXA Fall on same level from slipping, tripping and stumbling without subsequent striking against object, initial encounter: Secondary | ICD-10-CM | POA: Insufficient documentation

## 2013-06-06 DIAGNOSIS — Z87891 Personal history of nicotine dependence: Secondary | ICD-10-CM | POA: Insufficient documentation

## 2013-06-06 DIAGNOSIS — IMO0002 Reserved for concepts with insufficient information to code with codable children: Secondary | ICD-10-CM | POA: Insufficient documentation

## 2013-06-06 DIAGNOSIS — E785 Hyperlipidemia, unspecified: Secondary | ICD-10-CM | POA: Insufficient documentation

## 2013-06-06 DIAGNOSIS — S51012A Laceration without foreign body of left elbow, initial encounter: Secondary | ICD-10-CM

## 2013-06-06 DIAGNOSIS — Y9289 Other specified places as the place of occurrence of the external cause: Secondary | ICD-10-CM | POA: Insufficient documentation

## 2013-06-06 DIAGNOSIS — W19XXXA Unspecified fall, initial encounter: Secondary | ICD-10-CM

## 2013-06-06 DIAGNOSIS — S51009A Unspecified open wound of unspecified elbow, initial encounter: Secondary | ICD-10-CM | POA: Insufficient documentation

## 2013-06-06 DIAGNOSIS — Z79899 Other long term (current) drug therapy: Secondary | ICD-10-CM | POA: Insufficient documentation

## 2013-06-06 DIAGNOSIS — Z8551 Personal history of malignant neoplasm of bladder: Secondary | ICD-10-CM | POA: Insufficient documentation

## 2013-06-06 DIAGNOSIS — R1110 Vomiting, unspecified: Secondary | ICD-10-CM | POA: Insufficient documentation

## 2013-06-06 DIAGNOSIS — S0003XA Contusion of scalp, initial encounter: Secondary | ICD-10-CM | POA: Insufficient documentation

## 2013-06-06 DIAGNOSIS — Z8546 Personal history of malignant neoplasm of prostate: Secondary | ICD-10-CM | POA: Insufficient documentation

## 2013-06-06 DIAGNOSIS — Z7982 Long term (current) use of aspirin: Secondary | ICD-10-CM | POA: Insufficient documentation

## 2013-06-06 DIAGNOSIS — Y939 Activity, unspecified: Secondary | ICD-10-CM | POA: Insufficient documentation

## 2013-06-06 HISTORY — DX: Malignant (primary) neoplasm, unspecified: C80.1

## 2013-06-06 LAB — BASIC METABOLIC PANEL
BUN: 17 mg/dL (ref 6–23)
Chloride: 100 mEq/L (ref 96–112)
Creatinine, Ser: 0.88 mg/dL (ref 0.50–1.35)
GFR calc Af Amer: 83 mL/min — ABNORMAL LOW (ref >90)
GFR calc non Af Amer: 72 mL/min — ABNORMAL LOW (ref >90)
Glucose, Bld: 131 mg/dL — ABNORMAL HIGH (ref 70–99)

## 2013-06-06 LAB — CBC
HCT: 43.7 % (ref 39.0–52.0)
Hemoglobin: 15.1 g/dL (ref 13.0–17.0)
MCHC: 34.6 g/dL (ref 30.0–36.0)
MCV: 97.1 fL (ref 78.0–100.0)
RBC: 4.5 MIL/uL (ref 4.22–5.81)
RDW: 14.4 % (ref 11.5–15.5)
WBC: 13.7 10*3/uL — ABNORMAL HIGH (ref 4.0–10.5)

## 2013-06-06 MED ORDER — SODIUM CHLORIDE 0.9 % IV BOLUS (SEPSIS)
1000.0000 mL | Freq: Once | INTRAVENOUS | Status: AC
  Administered 2013-06-06: 1000 mL via INTRAVENOUS

## 2013-06-06 NOTE — ED Notes (Signed)
Bed: WA24 Expected date:  Expected time:  Means of arrival:  Comments: 

## 2013-06-06 NOTE — ED Provider Notes (Signed)
CSN: [id number]     Arrival date & time 06/06/13  7829 History   First MD Initiated Contact with Patient 06/06/13 0935     Chief Complaint  Patient presents with  . Fall  . Head Injury   (Consider location/radiation/quality/duration/timing/severity/associated sxs/prior Treatment) HPI Comments: Slipped on water in the bathroom floor. No loss of consciousness.  Patient is a 79 y.o. male presenting with fall and head injury. The history is provided by a relative and the patient.  Fall This is a new problem. The current episode started 1 to 2 hours ago. Episode frequency: once. The problem has been resolved. Pertinent negatives include no chest pain, no abdominal pain and no shortness of breath. Nothing aggravates the symptoms. Nothing relieves the symptoms.  Head Injury Associated symptoms: vomiting (2 episodes 2 days ago)     Past Medical History  Diagnosis Date  . Hyperlipidemia   . Cancer     Bladder and Prostate   Past Surgical History  Procedure Laterality Date  . Varicose veing stripping in 1950s     History reviewed. No pertinent family history. History  Substance Use Topics  . Smoking status: Former Games developer  . Smokeless tobacco: Never Used  . Alcohol Use: Yes     Comment: occasional    Review of Systems  Constitutional: Negative for fever.  Respiratory: Negative for cough and shortness of breath.   Cardiovascular: Negative for chest pain and leg swelling.  Gastrointestinal: Positive for vomiting (2 episodes 2 days ago). Negative for abdominal pain and diarrhea.  Neurological: Negative for dizziness.  All other systems reviewed and are negative.    Allergies  Review of patient's allergies indicates no known allergies.  Home Medications   Current Outpatient Rx  Name  Route  Sig  Dispense  Refill  . alendronate (FOSAMAX) 70 MG tablet   Oral   Take 70 mg by mouth every 7 (seven) days. Take with a full glass of water on an empty stomach.         Marland Kitchen aspirin  81 MG tablet   Oral   Take 81 mg by mouth daily.         Marland Kitchen atenolol (TENORMIN) 25 MG tablet   Oral   Take 25 mg by mouth daily.         . pravastatin (PRAVACHOL) 40 MG tablet   Oral   Take 40 mg by mouth daily.         . predniSONE (DELTASONE) 1 MG tablet   Oral   Take 1 mg by mouth daily.          BP 107/47  Pulse 100  Temp(Src) 98.2 F (36.8 C) (Oral)  Resp 16  SpO2 93% Physical Exam  Constitutional: He is oriented to person, place, and time. He appears well-developed and well-nourished. No distress.  HENT:  Head: Normocephalic.    Right Ear: External ear normal.  Left Ear: External ear normal.  Mouth/Throat: No oropharyngeal exudate.  Eyes: EOM are normal. Pupils are equal, round, and reactive to light.  Neck: Normal range of motion. Neck supple.  Cardiovascular: Normal rate and regular rhythm.  Exam reveals no friction rub.   No murmur heard. Pulmonary/Chest: Effort normal and breath sounds normal. No respiratory distress. He has no wheezes. He has no rales.  Abdominal: He exhibits no distension. There is no tenderness. There is no rebound.  Musculoskeletal: Normal range of motion. He exhibits no edema.       Left elbow: He  exhibits laceration (skin tears over radial head and olecranon process.). He exhibits normal range of motion, no swelling, no effusion and no deformity.  Neurological: He is alert and oriented to person, place, and time.  Skin: He is not diaphoretic.    ED Course  Procedures (including critical care time) Labs Review Labs Reviewed - No data to display Imaging Review No results found.  EKG Interpretation   None       MDM   1. Fall, initial encounter   2. Traumatic hematoma of forehead, initial encounter   3. Skin tear of left elbow without complication    79 year old male presents after a fall this morning. He slipped on water on the floor in the bathroom. He has left forehead area. He is on 81 mg aspirin a day but no other  blood thinners. He denies any preceding chest pain, shortness of breath, dizziness. Over the past few days he's had some vomiting with some decreased by mouth intake. His family thought he might have a stomach bug. Here, relaxing comfortably in no acute distress. Patient has abrasion and mild hematoma on left upper for head area. There is no bony deformity. He has no neck or back tenderness. His lungs are clear his belly is benign. He has a few skin tears on his left elbow, but full range of motion and no bony tenderness across the elbow. Will CT his head, x-rays of the elbow. Since he's been feeling under the weather with his stomach issues, will check basic labs and give a small amount of fluid. CT normal, xray of L elbow normal. Labs normal. Patient is feeling better after fluids, stable for discharge.   Dagmar Hait, MD 06/06/13 [phone]

## 2013-06-06 NOTE — ED Notes (Signed)
Patient transported to X-ray.  Pt provided with warm blankets.

## 2013-06-06 NOTE — ED Notes (Signed)
Pt c/o headache and skin tears to L elbow after a fall this morning.  Pt sts "the floor was wet, I slipped, and fell."  Hematoma noted to L forehead and multiple skin tears noted to L elbow.  Pt's daughter sts "he had a stomach bug on Friday and Saturday.  I'm not sure if that had anything to do with it."  A & Ox4.

## 2013-06-29 ENCOUNTER — Encounter: Payer: Self-pay | Admitting: Interventional Cardiology

## 2013-06-29 ENCOUNTER — Ambulatory Visit (INDEPENDENT_AMBULATORY_CARE_PROVIDER_SITE_OTHER): Payer: Medicare Other | Admitting: Interventional Cardiology

## 2013-06-29 VITALS — BP 110/64 | HR 62 | Wt 180.0 lb

## 2013-06-29 DIAGNOSIS — E785 Hyperlipidemia, unspecified: Secondary | ICD-10-CM | POA: Insufficient documentation

## 2013-06-29 DIAGNOSIS — I2510 Atherosclerotic heart disease of native coronary artery without angina pectoris: Secondary | ICD-10-CM

## 2013-06-29 DIAGNOSIS — R0600 Dyspnea, unspecified: Secondary | ICD-10-CM | POA: Insufficient documentation

## 2013-06-29 DIAGNOSIS — R0609 Other forms of dyspnea: Secondary | ICD-10-CM

## 2013-06-29 DIAGNOSIS — R0989 Other specified symptoms and signs involving the circulatory and respiratory systems: Secondary | ICD-10-CM

## 2013-06-29 LAB — CBC WITH DIFFERENTIAL/PLATELET
Basophils Absolute: 0 10*3/uL (ref 0.0–0.1)
Basophils Relative: 0.3 % (ref 0.0–3.0)
Eosinophils Absolute: 0.1 10*3/uL (ref 0.0–0.7)
Eosinophils Relative: 1.5 % (ref 0.0–5.0)
HCT: 41.3 % (ref 39.0–52.0)
Hemoglobin: 13.9 g/dL (ref 13.0–17.0)
LYMPHS PCT: 18.4 % (ref 12.0–46.0)
Lymphs Abs: 1.6 10*3/uL (ref 0.7–4.0)
MCHC: 33.6 g/dL (ref 30.0–36.0)
MCV: 96.8 fl (ref 78.0–100.0)
MONOS PCT: 6.6 % (ref 3.0–12.0)
Monocytes Absolute: 0.6 10*3/uL (ref 0.1–1.0)
NEUTROS ABS: 6.6 10*3/uL (ref 1.4–7.7)
Neutrophils Relative %: 73.2 % (ref 43.0–77.0)
Platelets: 209 10*3/uL (ref 150.0–400.0)
RBC: 4.27 Mil/uL (ref 4.22–5.81)
RDW: 14.8 % — ABNORMAL HIGH (ref 11.5–14.6)
WBC: 9 10*3/uL (ref 4.5–10.5)

## 2013-06-29 LAB — BASIC METABOLIC PANEL
BUN: 19 mg/dL (ref 6–23)
CHLORIDE: 107 meq/L (ref 96–112)
CO2: 30 meq/L (ref 19–32)
CREATININE: 1 mg/dL (ref 0.4–1.5)
Calcium: 8.8 mg/dL (ref 8.4–10.5)
GFR: 78.56 mL/min (ref >60.00)
Glucose, Bld: 92 mg/dL (ref 70–99)
Potassium: 4.1 mEq/L (ref 3.5–5.1)
SODIUM: 141 meq/L (ref 135–145)

## 2013-06-29 LAB — BRAIN NATRIURETIC PEPTIDE: PRO B NATRI PEPTIDE: 79 pg/mL (ref 0.0–100.0)

## 2013-06-29 NOTE — Progress Notes (Signed)
Patient ID: Derrick Andrews, male   DOB: 1918/02/19, 80 y.o.   MRN: [id number]    [address], [address] [place], [place]  [address] Phone: [phone] Fax:  [phone]  Date:  06/29/2013   ID:  Derrick Andrews, DOB May 13, 1918, MRN [id number]  PCP:  Horton Finer, MD   ASSESSMENT:  1. Dyspnea on exertion, possibly an anginal equivalent  2. Coronary artery disease with prior stenting 3. Hyperlipidemia 4. Recent fall, cannot exclude the possibility of syncope.. suspect frailty and weakness as a cause.  PLAN:  1. Brain natruretic peptide, basic metabolic panel and CBC. Further actions may be taken depending upon the results of the laboratory data. 2.Continue current medical regimen as listed 3. Followup as needed   SUBJECTIVE: Derrick Andrews is a 80 y.o. male who complains of exertional dyspnea but no chest discomfort. He denies orthopnea. He has not used nitroglycerin. There is no lower extremity swelling. No palpitations. He did fall owing to the bathroom. He was on the floor for a while and could not get up until his grandson got there to help him. He was seen in the emergency room and discharged.   Wt Readings from Last 3 Encounters:  06/29/13 180 lb (81.647 kg)  03/15/12 187 lb (84.823 kg)  02/03/12 186 lb 9.6 oz (84.641 kg)     Past Medical History  Diagnosis Date  . Hyperlipidemia   . Cancer     Bladder and Prostate    Current Outpatient Prescriptions  Medication Sig Dispense Refill  . alendronate (FOSAMAX) 70 MG tablet Take 70 mg by mouth every 7 (seven) days. Take with a full glass of water on an empty stomach.      Marland Kitchen aspirin 81 MG tablet Take 81 mg by mouth daily.      Marland Kitchen atenolol (TENORMIN) 25 MG tablet Take 25 mg by mouth daily.      . cyanocobalamin (,VITAMIN B-12,) 1000 MCG/ML injection Inject 1,000 mcg into the muscle every 30 (thirty) days.      . nitroGLYCERIN (NITROSTAT) 0.4 MG SL tablet Place 0.4 mg under the tongue every 5 (five) minutes as  needed for chest pain.      Marland Kitchen omeprazole (PRILOSEC) 20 MG capsule Take 20 mg by mouth daily.      Marland Kitchen oxybutynin (DITROPAN) 5 MG tablet Take 5 mg by mouth 2 (two) times daily.      . pravastatin (PRAVACHOL) 40 MG tablet Take 40 mg by mouth daily.      . predniSONE (DELTASONE) 1 MG tablet Take 2 mg by mouth daily.       . Vitamin D, Ergocalciferol, (DRISDOL) 50000 UNITS CAPS capsule Take 50,000 Units by mouth every 7 (seven) days.       No current facility-administered medications for this visit.    Allergies:   No Known Allergies  Social History:  The patient  reports that he has quit smoking. He has never used smokeless tobacco. He reports that he drinks alcohol. He reports that he does not use illicit drugs.   ROS:  Please see the history of present illness.   Denies edema. No orthopnea or PND. No nitroglycerin use.   All other systems reviewed and negative.   OBJECTIVE: VS:  BP 110/64  Pulse 62  Wt 180 lb (81.647 kg) Well nourished, well developed, in no acute distress, pale appearing HEENT: normal Neck: JVD flat. Carotid bruit absent  Cardiac:  normal S1, S2; RRR; no murmur  Lungs:  clear to auscultation bilaterally, no wheezing, rhonchi or rales Abd: soft, nontender, no hepatomegaly Ext: Edema absent. Pulses 2+ Skin: warm and dry Neuro:  CNs 2-12 intact, no focal abnormalities noted  EKG:  Normal sinus rhythm with lateral Q waves. LVH with strain.     Past Medical History  CAD with prior LAD stent. Non-ischemic cardiolite, LVEF 67% , 2009   Polymyalgia rheumatica   Hyperlipidemia   Prostate cancer, s/p radiation treatment   OA in knees   Bladder cancer   Pernicious anemia 2013     Signed, Illene Labrador III, MD 06/29/2013 3:23 PM

## 2013-06-29 NOTE — Patient Instructions (Signed)
Your physician recommends that you continue on your current medications as directed. Please refer to the Current Medication list given to you today.  Labs Today: Bmet, Bnp, CBC  Your physician recommends that you schedule a follow-up appointment as needed

## 2013-07-08 ENCOUNTER — Telehealth: Payer: Self-pay

## 2013-07-08 ENCOUNTER — Ambulatory Visit: Payer: Medicare Other | Admitting: Physical Medicine & Rehabilitation

## 2013-07-08 NOTE — Telephone Encounter (Signed)
pt given lab results.All of the lab work is okay. No explanation for the shortness of breath was found. It could be related to overall condition and age. No further workup.pt verbalized undrestanding.

## 2013-07-08 NOTE — Telephone Encounter (Signed)
Message copied by Lamar Laundry on Fri Jul 08, 2013  8:24 AM ------      Message from: Daneen Schick      Created: Wed Jun 29, 2013  5:35 PM       All of the lab work is okay. No explanation for the shortness of breath was found. It could be related to overall condition and age. No further workup. ------

## 2013-08-13 ENCOUNTER — Encounter: Payer: Self-pay | Admitting: *Deleted

## 2013-11-01 ENCOUNTER — Other Ambulatory Visit: Payer: Self-pay | Admitting: Urology

## 2013-12-01 ENCOUNTER — Telehealth: Payer: Self-pay | Admitting: Interventional Cardiology

## 2013-12-01 NOTE — Telephone Encounter (Signed)
New problem    Need to know status of clearance that was faxed.

## 2013-12-20 NOTE — Telephone Encounter (Signed)
Clearance signed by Dr. Tamala Julian. Pt will be at an increased risk (moderate) for CV complications.

## 2013-12-20 NOTE — Telephone Encounter (Signed)
Clearance here for Dr Tamala Julian to review.  Will ask Dr Tamala Julian to review today.

## 2013-12-20 NOTE — Telephone Encounter (Signed)
Follow up    Office calling stating they left 2 message regarding cardiac clearance.    Surgery is  8/13.

## 2013-12-20 NOTE — Telephone Encounter (Signed)
Will put in nurse fax to be faxed.

## 2013-12-29 ENCOUNTER — Ambulatory Visit (INDEPENDENT_AMBULATORY_CARE_PROVIDER_SITE_OTHER): Payer: Medicare Other | Admitting: Emergency Medicine

## 2013-12-29 VITALS — BP 120/62 | HR 95 | Temp 97.6°F | Resp 18 | Ht 72.0 in | Wt 176.0 lb

## 2013-12-29 DIAGNOSIS — B079 Viral wart, unspecified: Secondary | ICD-10-CM

## 2013-12-29 NOTE — Progress Notes (Signed)
[facility] [address] [place] [address] [phone]  Date:  12/29/2013   Name:  Derrick Andrews   DOB:  [birth year]   MRN:  [id number]  PCP:  Horton Finer, MD    Chief Complaint: place on face near right eye   History of Present Illness:  Derrick Andrews is a 80 y.o. very pleasant male patient who presents with the following:  Has a lesion lateral to right eye.  Says it popped up over the past two weeks and he treated it like a "pimple".  Has not responded.  Denies other complaint or health concern today.   Patient Active Problem List   Diagnosis Date Noted  . Dyspnea 06/29/2013  . CAD in native artery 06/29/2013  . Hyperlipidemia 06/29/2013  . Lumbosacral spondylosis without myelopathy 09/04/2011    Past Medical History  Diagnosis Date  . Hyperlipidemia   . Cancer     Bladder and Prostate    Past Surgical History  Procedure Laterality Date  . Varicose veing stripping in 1950s      History  Substance Use Topics  . Smoking status: Former Research scientist (life sciences)  . Smokeless tobacco: Never Used  . Alcohol Use: Yes     Comment: occasional    History reviewed. No pertinent family history.  Allergies  Allergen Reactions  . Mercury Ammoniated [Ammoniated Mercury]     Medication list has been reviewed and updated.  Current Outpatient Prescriptions on File Prior to Visit  Medication Sig Dispense Refill  . atenolol (TENORMIN) 25 MG tablet Take 25 mg by mouth daily.      . cyanocobalamin (,VITAMIN B-12,) 1000 MCG/ML injection Inject 1,000 mcg into the muscle every 30 (thirty) days.      . nitroGLYCERIN (NITROSTAT) 0.4 MG SL tablet Place 0.4 mg under the tongue every 5 (five) minutes as needed for chest pain.      Marland Kitchen omeprazole (PRILOSEC) 20 MG capsule Take 20 mg by mouth daily.      Marland Kitchen oxybutynin (DITROPAN) 5 MG tablet Take 5 mg by mouth 2 (two) times daily.      . pravastatin (PRAVACHOL) 40 MG tablet Take 40 mg by mouth daily.      . predniSONE  (DELTASONE) 1 MG tablet Take 2 mg by mouth daily.       . Vitamin D, Ergocalciferol, (DRISDOL) 50000 UNITS CAPS capsule Take 50,000 Units by mouth every 7 (seven) days.      Marland Kitchen alendronate (FOSAMAX) 70 MG tablet Take 70 mg by mouth every 7 (seven) days. Take with a full glass of water on an empty stomach.      Marland Kitchen aspirin 81 MG tablet Take 81 mg by mouth daily.       No current facility-administered medications on file prior to visit.    Review of Systems:  As per HPI, otherwise negative.    Physical Examination: Filed Vitals:   12/29/13 1459  BP: 120/62  Pulse: 95  Temp: 97.6 F (36.4 C)  Resp: 18   Filed Vitals:   12/29/13 1459  Height: 6' (1.829 m)  Weight: 176 lb (79.833 kg)   Body mass index is 23.86 kg/(m^2). Ideal Body Weight: Weight in (lb) to have BMI = 25: 183.9   GEN: WDWN, NAD, Non-toxic, Alert & Oriented x 3 HEENT: Atraumatic, Normocephalic.  Ears and Nose: No external deformity. EXTR: No clubbing/cyanosis/edema NEURO: Normal gait.  PSYCH: Normally interactive. Conversant. Not depressed or anxious appearing.  Calm demeanor.  SKIN:  Warty looking growth lateral to right eye.  Not tender.  Assessment and Plan: Wart  Signed,  Ellison Carwin, MD

## 2013-12-29 NOTE — Patient Instructions (Signed)
Warts Warts are a common viral infection. They are most commonly caused by the human papillomavirus (HPV). Warts can occur at all ages. However, they occur most frequently in older children and infrequently in the elderly. Warts may be single or multiple. Location and size varies. Warts can be spread by scratching the wart and then scratching normal skin. The life cycle of warts varies. However, most will disappear over many months to a couple years. Warts commonly do not cause problems (asymptomatic) unless they are over an area of pressure, such as the bottom of the foot. If they are large enough, they may cause pain with walking. DIAGNOSIS  Warts are most commonly diagnosed by their appearance. Tissue samples (biopsies) are not required unless the wart looks abnormal. Most warts have a rough surface, are round, oval, or irregular, and are skin-colored to light yellow, brown, or gray. They are generally less than  inch (1.3 cm), but they can be any size. TREATMENT   Observation or no treatment.  Freezing with liquid nitrogen.  High heat (cautery).  Boosting the body's immunity to fight off the wart (immunotherapy using Candida antigen).  Laser surgery.  Application of various irritants and solutions. HOME CARE INSTRUCTIONS  Follow your caregiver's instructions. No special precautions are necessary. Often, treatment may be followed by a return (recurrence) of warts. Warts are generally difficult to treat and get rid of. If treatment is done in a clinic setting, usually more than 1 treatment is required. This is usually done on only a monthly basis until the wart is completely gone. SEEK IMMEDIATE MEDICAL CARE IF: The treated skin becomes red, puffy (swollen), or painful. Document Released: 03/05/2005 Document Revised: 09/20/2012 Document Reviewed: 08/31/2009 ExitCare Patient Information 2015 [employer]. This information is not intended to replace advice given to you by your health care  provider. Make sure you discuss any questions you have with your health care provider.  

## 2013-12-29 NOTE — Addendum Note (Signed)
Addended by: Roselee Culver on: 12/29/2013 04:44 PM   Modules accepted: Level of Service

## 2014-01-05 ENCOUNTER — Encounter (HOSPITAL_COMMUNITY): Payer: Self-pay | Admitting: Pharmacy Technician

## 2014-01-06 ENCOUNTER — Ambulatory Visit (HOSPITAL_COMMUNITY)
Admission: RE | Admit: 2014-01-06 | Discharge: 2014-01-06 | Disposition: A | Discharge status: Home / Self Care | Payer: Medicare Other | Source: Ambulatory Visit | Attending: Urology | Admitting: Urology

## 2014-01-06 ENCOUNTER — Encounter (HOSPITAL_COMMUNITY)
Admission: RE | Admit: 2014-01-06 | Discharge: 2014-01-06 | Disposition: A | Discharge status: Home / Self Care | Payer: Medicare Other | Source: Ambulatory Visit | Attending: Urology | Admitting: Urology

## 2014-01-06 ENCOUNTER — Encounter (HOSPITAL_COMMUNITY): Payer: Self-pay

## 2014-01-06 DIAGNOSIS — Z01818 Encounter for other preprocedural examination: Secondary | ICD-10-CM | POA: Diagnosis present

## 2014-01-06 HISTORY — DX: Unspecified osteoarthritis, unspecified site: M19.90

## 2014-01-06 HISTORY — DX: Gastro-esophageal reflux disease without esophagitis: K21.9

## 2014-01-06 HISTORY — DX: Blindness, one eye, unspecified eye: H54.40

## 2014-01-06 HISTORY — DX: Unspecified hearing loss, bilateral: H91.93

## 2014-01-06 HISTORY — DX: Frequency of micturition: R35.0

## 2014-01-06 HISTORY — DX: Atherosclerotic heart disease of native coronary artery without angina pectoris: I25.10

## 2014-01-06 HISTORY — DX: Unsteadiness on feet: R26.81

## 2014-01-06 LAB — CBC
HEMATOCRIT: 45.5 % (ref 39.0–52.0)
Hemoglobin: 14.7 g/dL (ref 13.0–17.0)
MCH: 32.2 pg (ref 26.0–34.0)
MCHC: 32.3 g/dL (ref 30.0–36.0)
MCV: 99.6 fL (ref 78.0–100.0)
Platelets: 149 10*3/uL — ABNORMAL LOW (ref 150–400)
RBC: 4.57 MIL/uL (ref 4.22–5.81)
RDW: 14.8 % (ref 11.5–15.5)
WBC: 9.3 10*3/uL (ref 4.0–10.5)

## 2014-01-06 LAB — BASIC METABOLIC PANEL
Anion gap: 10 (ref 5–15)
BUN: 21 mg/dL (ref 6–23)
CALCIUM: 9.4 mg/dL (ref 8.4–10.5)
CO2: 28 mEq/L (ref 19–32)
Chloride: 102 mEq/L (ref 96–112)
Creatinine, Ser: 0.96 mg/dL (ref 0.50–1.35)
GFR calc Af Amer: 80 mL/min — ABNORMAL LOW (ref >90)
GFR calc non Af Amer: 69 mL/min — ABNORMAL LOW (ref >90)
GLUCOSE: 117 mg/dL — AB (ref 70–99)
Potassium: 4.5 mEq/L (ref 3.7–5.3)
Sodium: 140 mEq/L (ref 137–147)

## 2014-01-06 NOTE — Patient Instructions (Signed)
YOUR SURGERY IS SCHEDULED AT [facility]  ON:    Thursday  8/13  REPORT TO  SHORT STAY CENTER AT:  6:45 AM   PLEASE COME IN THE [facility] MAIN HOSPITAL ENTRANCE AND FOLLOW SIGNS TO SHORT STAY CENTER.  DO NOT EAT OR DRINK ANYTHING AFTER MIDNIGHT THE NIGHT BEFORE YOUR SURGERY.  YOU MAY BRUSH YOUR TEETH, RINSE OUT YOUR MOUTH--BUT NO WATER, NO FOOD, NO CHEWING GUM, NO MINTS, NO CANDIES, NO CHEWING TOBACCO.  PLEASE TAKE THE FOLLOWING MEDICATIONS THE AM OF YOUR SURGERY WITH A FEW SIPS OF WATER:    ATENOLOL,   OMEPRAZOLE,   OXYBUTYNIN, PREDNISONE.  I DO NOT BRING VALUABLES, MONEY, CREDIT CARDS.  DO NOT WEAR JEWELRY, MAKE-UP, NAIL POLISH AND NO METAL PINS OR CLIPS IN YOUR HAIR. CONTACT LENS, DENTURES / PARTIALS, GLASSES SHOULD NOT BE WORN TO SURGERY AND IN MOST CASES-HEARING AIDS WILL NEED TO BE REMOVED.  BRING YOUR GLASSES CASE, ANY EQUIPMENT NEEDED FOR YOUR CONTACT LENS. FOR PATIENTS ADMITTED TO THE HOSPITAL--CHECK OUT TIME THE DAY OF DISCHARGE IS 11:00 AM.  ALL INPATIENT ROOMS ARE PRIVATE - WITH BATHROOM, TELEPHONE, TELEVISION AND WIFI INTERNET.  I                                                  PLEASE READ OVER ANY  FACT SHEETS THAT YOU WERE GIVEN: MRSA INFORMATION, BLOOD TRANSFUSION INFORMATION, INCENTIVE SPIROMETER INFORMATION.  PLEASE BE AWARE THAT YOU MAY NEED ADDITIONAL BLOOD DRAWN DAY OF YOUR SURGERY  _______________________________________________________________________   [facility] - Preparing for Surgery Before surgery, you can play an important role.  Because skin is not sterile, your skin needs to be as free of germs as possible.  You can reduce the number of germs on your skin by washing with CHG (chlorahexidine gluconate) soap before surgery.  CHG is an antiseptic cleaner which kills germs and bonds with the skin to continue killing germs even after washing. Please DO NOT use if you have an allergy to CHG or antibacterial soaps.  If your skin becomes  reddened/irritated stop using the CHG and inform your nurse when you arrive at Short Stay. Do not shave (including legs and underarms) for at least 48 hours prior to the first CHG shower.  You may shave your face/neck. Please follow these instructions carefully:  1.  Shower with CHG Soap the night before surgery and the  morning of Surgery.  2.  If you choose to wash your hair, wash your hair first as usual with your  normal  shampoo.  3.  After you shampoo, rinse your hair and body thoroughly to remove the  shampoo.                           4.  Use CHG as you would any other liquid soap.  You can apply chg directly  to the skin and wash                       Gently with a scrungie or clean washcloth.  5.  Apply the CHG Soap to your body ONLY FROM THE NECK DOWN.   Do not use on face/ open  Wound or open sores. Avoid contact with eyes, ears mouth and genitals (private parts).                       Wash face,  Genitals (private parts) with your normal soap.             6.  Wash thoroughly, paying special attention to the area where your surgery  will be performed.  7.  Thoroughly rinse your body with warm water from the neck down.  8.  DO NOT shower/wash with your normal soap after using and rinsing off  the CHG Soap.                9.  Pat yourself dry with a clean towel.            10.  Wear clean pajamas.            11.  Place clean sheets on your bed the night of your first shower and do not  sleep with pets. Day of Surgery : Do not apply any lotions/deodorants the morning of surgery.  Please wear clean clothes to the hospital/surgery center.  FAILURE TO FOLLOW THESE INSTRUCTIONS MAY RESULT IN THE CANCELLATION OF YOUR SURGERY PATIENT SIGNATURE_________________________________  NURSE SIGNATURE__________________________________  ________________________________________________________________________

## 2014-01-06 NOTE — Pre-Procedure Instructions (Signed)
EKG AND OV 06-30-11 IN EPIC FROM CARDIOLOGIST DR. Linard Millers. CXR WAS DONE TODAY PREOP

## 2014-01-19 ENCOUNTER — Encounter (HOSPITAL_COMMUNITY): Payer: Medicare Other | Admitting: Anesthesiology

## 2014-01-19 ENCOUNTER — Inpatient Hospital Stay (HOSPITAL_COMMUNITY): Payer: Medicare Other | Admitting: Anesthesiology

## 2014-01-19 ENCOUNTER — Ambulatory Visit (HOSPITAL_COMMUNITY)
Admission: RE | Admit: 2014-01-19 | Discharge: 2014-01-20 | Disposition: A | Discharge status: Home / Self Care | Payer: Medicare Other | Source: Ambulatory Visit | Attending: Urology | Admitting: Urology

## 2014-01-19 ENCOUNTER — Encounter (HOSPITAL_COMMUNITY)
Admission: RE | Disposition: A | Discharge status: Home / Self Care | Payer: Self-pay | Source: Ambulatory Visit | Attending: Urology

## 2014-01-19 ENCOUNTER — Encounter (HOSPITAL_COMMUNITY): Payer: Self-pay | Admitting: Anesthesiology

## 2014-01-19 DIAGNOSIS — Z7982 Long term (current) use of aspirin: Secondary | ICD-10-CM | POA: Diagnosis not present

## 2014-01-19 DIAGNOSIS — C61 Malignant neoplasm of prostate: Secondary | ICD-10-CM | POA: Insufficient documentation

## 2014-01-19 DIAGNOSIS — Z79899 Other long term (current) drug therapy: Secondary | ICD-10-CM | POA: Diagnosis not present

## 2014-01-19 DIAGNOSIS — C679 Malignant neoplasm of bladder, unspecified: Principal | ICD-10-CM | POA: Insufficient documentation

## 2014-01-19 DIAGNOSIS — N3040 Irradiation cystitis without hematuria: Secondary | ICD-10-CM | POA: Insufficient documentation

## 2014-01-19 DIAGNOSIS — C675 Malignant neoplasm of bladder neck: Secondary | ICD-10-CM | POA: Diagnosis present

## 2014-01-19 DIAGNOSIS — R3129 Other microscopic hematuria: Secondary | ICD-10-CM | POA: Diagnosis not present

## 2014-01-19 DIAGNOSIS — N3941 Urge incontinence: Secondary | ICD-10-CM | POA: Insufficient documentation

## 2014-01-19 HISTORY — PX: TRANSURETHRAL RESECTION OF PROSTATE: SHX73

## 2014-01-19 SURGERY — TURP (TRANSURETHRAL RESECTION OF PROSTATE)
Anesthesia: General

## 2014-01-19 MED ORDER — LACTATED RINGERS IV SOLN
INTRAVENOUS | Status: DC | PRN
  Administered 2014-01-19 (×2): via INTRAVENOUS

## 2014-01-19 MED ORDER — PANTOPRAZOLE SODIUM 40 MG PO TBEC
40.0000 mg | DELAYED_RELEASE_TABLET | Freq: Every day | ORAL | Status: DC
Start: 2014-01-20 — End: 2014-01-20
  Administered 2014-01-20: 40 mg via ORAL
  Filled 2014-01-19: qty 1

## 2014-01-19 MED ORDER — HYDROMORPHONE HCL PF 1 MG/ML IJ SOLN: 0.5000 mg | INTRAMUSCULAR | Status: DC | PRN

## 2014-01-19 MED ORDER — BOOST PLUS PO LIQD
237.0000 mL | ORAL | Status: DC
  Administered 2014-01-19: 237 mL via ORAL
  Filled 2014-01-19 (×2): qty 237

## 2014-01-19 MED ORDER — LIDOCAINE HCL (CARDIAC) 20 MG/ML IV SOLN
INTRAVENOUS | Status: AC
  Filled 2014-01-19: qty 5

## 2014-01-19 MED ORDER — DIPHENHYDRAMINE HCL 12.5 MG/5ML PO ELIX
12.5000 mg | ORAL_SOLUTION | Freq: Four times a day (QID) | ORAL | Status: DC | PRN
Start: 2014-01-19 — End: 2014-01-20

## 2014-01-19 MED ORDER — EPHEDRINE SULFATE 50 MG/ML IJ SOLN
INTRAMUSCULAR | Status: DC | PRN
  Administered 2014-01-19 (×2): 5 mg via INTRAVENOUS

## 2014-01-19 MED ORDER — OXYBUTYNIN CHLORIDE 5 MG PO TABS
5.0000 mg | ORAL_TABLET | Freq: Three times a day (TID) | ORAL | Status: DC | PRN
Start: 2014-01-19 — End: 2014-01-20
  Filled 2014-01-19: qty 1

## 2014-01-19 MED ORDER — VITAMIN D (ERGOCALCIFEROL) 1.25 MG (50000 UNIT) PO CAPS: 50000.0000 [IU] | ORAL_CAPSULE | ORAL | Status: DC

## 2014-01-19 MED ORDER — BELLADONNA ALKALOIDS-OPIUM 16.2-60 MG RE SUPP
1.0000 | Freq: Four times a day (QID) | RECTAL | Status: DC | PRN
  Administered 2014-01-19: 1 via RECTAL
  Filled 2014-01-19: qty 1

## 2014-01-19 MED ORDER — PROMETHAZINE HCL 25 MG/ML IJ SOLN: 6.2500 mg | INTRAMUSCULAR | Status: DC | PRN

## 2014-01-19 MED ORDER — CIPROFLOXACIN HCL 500 MG PO TABS
500.0000 mg | ORAL_TABLET | Freq: Two times a day (BID) | ORAL | Status: DC
  Administered 2014-01-19 – 2014-01-20 (×3): 500 mg via ORAL
  Filled 2014-01-19 (×5): qty 1

## 2014-01-19 MED ORDER — DEXAMETHASONE SODIUM PHOSPHATE 10 MG/ML IJ SOLN
INTRAMUSCULAR | Status: AC
  Filled 2014-01-19: qty 1

## 2014-01-19 MED ORDER — BACITRACIN-NEOMYCIN-POLYMYXIN 400-5-5000 EX OINT: 1.0000 "application " | TOPICAL_OINTMENT | Freq: Three times a day (TID) | CUTANEOUS | Status: DC | PRN

## 2014-01-19 MED ORDER — EPHEDRINE SULFATE 50 MG/ML IJ SOLN
INTRAMUSCULAR | Status: AC
  Filled 2014-01-19: qty 1

## 2014-01-19 MED ORDER — CEFAZOLIN SODIUM-DEXTROSE 2-3 GM-% IV SOLR
2.0000 g | INTRAVENOUS | Status: AC
  Administered 2014-01-19: 2 g via INTRAVENOUS

## 2014-01-19 MED ORDER — LIDOCAINE HCL (CARDIAC) 20 MG/ML IV SOLN
INTRAVENOUS | Status: DC | PRN
  Administered 2014-01-19: 50 mg via INTRAVENOUS

## 2014-01-19 MED ORDER — OXYBUTYNIN CHLORIDE 5 MG PO TABS
5.0000 mg | ORAL_TABLET | Freq: Two times a day (BID) | ORAL | Status: DC
  Administered 2014-01-19 – 2014-01-20 (×2): 5 mg via ORAL
  Filled 2014-01-19 (×3): qty 1

## 2014-01-19 MED ORDER — SODIUM CHLORIDE 0.9 % IJ SOLN
INTRAMUSCULAR | Status: AC
  Filled 2014-01-19: qty 10

## 2014-01-19 MED ORDER — BELLADONNA ALKALOIDS-OPIUM 16.2-60 MG RE SUPP
RECTAL | Status: DC | PRN
  Administered 2014-01-19: 1 via RECTAL

## 2014-01-19 MED ORDER — FENTANYL CITRATE 0.05 MG/ML IJ SOLN
25.0000 ug | INTRAMUSCULAR | Status: DC | PRN
  Administered 2014-01-19 (×2): 25 ug via INTRAVENOUS

## 2014-01-19 MED ORDER — FENTANYL CITRATE 0.05 MG/ML IJ SOLN
INTRAMUSCULAR | Status: AC
  Filled 2014-01-19: qty 2

## 2014-01-19 MED ORDER — ZOLPIDEM TARTRATE 5 MG PO TABS: 5.0000 mg | ORAL_TABLET | Freq: Every evening | ORAL | Status: DC | PRN

## 2014-01-19 MED ORDER — PROPOFOL 10 MG/ML IV BOLUS
INTRAVENOUS | Status: AC
  Filled 2014-01-19: qty 20

## 2014-01-19 MED ORDER — OXYCODONE-ACETAMINOPHEN 5-325 MG PO TABS
1.0000 | ORAL_TABLET | ORAL | Status: DC | PRN
  Administered 2014-01-19: 2 via ORAL
  Filled 2014-01-19: qty 2

## 2014-01-19 MED ORDER — DEXTROSE-NACL 5-0.45 % IV SOLN
INTRAVENOUS | Status: DC
  Administered 2014-01-19: 13:00:00 via INTRAVENOUS

## 2014-01-19 MED ORDER — SODIUM CHLORIDE 0.9 % IR SOLN
3000.0000 mL | Status: DC
  Administered 2014-01-19 (×2): 3000 mL

## 2014-01-19 MED ORDER — BELLADONNA ALKALOIDS-OPIUM 16.2-60 MG RE SUPP
RECTAL | Status: AC
  Filled 2014-01-19: qty 1

## 2014-01-19 MED ORDER — ONDANSETRON HCL 4 MG/2ML IJ SOLN: 4.0000 mg | INTRAMUSCULAR | Status: DC | PRN

## 2014-01-19 MED ORDER — MEPERIDINE HCL 50 MG/ML IJ SOLN: 6.2500 mg | INTRAMUSCULAR | Status: DC | PRN

## 2014-01-19 MED ORDER — PREDNISONE 1 MG PO TABS
2.0000 mg | ORAL_TABLET | Freq: Every day | ORAL | Status: DC
  Administered 2014-01-20: 2 mg via ORAL
  Filled 2014-01-19 (×2): qty 2

## 2014-01-19 MED ORDER — ONDANSETRON HCL 4 MG/2ML IJ SOLN
INTRAMUSCULAR | Status: DC | PRN
  Administered 2014-01-19: 4 mg via INTRAVENOUS

## 2014-01-19 MED ORDER — NITROGLYCERIN 0.4 MG SL SUBL: 0.4000 mg | SUBLINGUAL_TABLET | SUBLINGUAL | Status: DC | PRN

## 2014-01-19 MED ORDER — DIPHENHYDRAMINE HCL 50 MG/ML IJ SOLN
12.5000 mg | Freq: Four times a day (QID) | INTRAMUSCULAR | Status: DC | PRN
Start: 2014-01-19 — End: 2014-01-20

## 2014-01-19 MED ORDER — ATENOLOL 25 MG PO TABS
25.0000 mg | ORAL_TABLET | Freq: Every morning | ORAL | Status: DC
  Administered 2014-01-20: 25 mg via ORAL
  Filled 2014-01-19: qty 1

## 2014-01-19 MED ORDER — FENTANYL CITRATE 0.05 MG/ML IJ SOLN
INTRAMUSCULAR | Status: DC | PRN
  Administered 2014-01-19 (×4): 25 ug via INTRAVENOUS

## 2014-01-19 MED ORDER — ONDANSETRON HCL 4 MG/2ML IJ SOLN
INTRAMUSCULAR | Status: AC
  Filled 2014-01-19: qty 2

## 2014-01-19 MED ORDER — PROPOFOL 10 MG/ML IV BOLUS
INTRAVENOUS | Status: DC | PRN
  Administered 2014-01-19: 20 mg via INTRAVENOUS
  Administered 2014-01-19: 150 mg via INTRAVENOUS

## 2014-01-19 MED ORDER — CEFAZOLIN SODIUM-DEXTROSE 2-3 GM-% IV SOLR
INTRAVENOUS | Status: AC
  Filled 2014-01-19: qty 50

## 2014-01-19 MED ORDER — SIMVASTATIN 5 MG PO TABS
5.0000 mg | ORAL_TABLET | Freq: Every day | ORAL | Status: DC
  Administered 2014-01-19: 5 mg via ORAL
  Filled 2014-01-19 (×2): qty 1

## 2014-01-19 MED ORDER — CETYLPYRIDINIUM CHLORIDE 0.05 % MT LIQD: 7.0000 mL | Freq: Two times a day (BID) | OROMUCOSAL | Status: DC

## 2014-01-19 MED ORDER — ACETAMINOPHEN 10 MG/ML IV SOLN
1000.0000 mg | Freq: Once | INTRAVENOUS | Status: AC
  Administered 2014-01-19: 1000 mg via INTRAVENOUS
  Filled 2014-01-19: qty 100

## 2014-01-19 MED ORDER — SENNOSIDES-DOCUSATE SODIUM 8.6-50 MG PO TABS
2.0000 | ORAL_TABLET | Freq: Every day | ORAL | Status: DC
  Administered 2014-01-19: 2 via ORAL
  Filled 2014-01-19 (×2): qty 2

## 2014-01-19 MED ORDER — SODIUM CHLORIDE 0.9 % IR SOLN
Status: DC | PRN
  Administered 2014-01-19 (×2): 3000 mL

## 2014-01-19 MED ORDER — DEXAMETHASONE SODIUM PHOSPHATE 10 MG/ML IJ SOLN
INTRAMUSCULAR | Status: DC | PRN
  Administered 2014-01-19: 10 mg via INTRAVENOUS

## 2014-01-19 MED ORDER — CYANOCOBALAMIN 1000 MCG/ML IJ SOLN: 1000.0000 ug | INTRAMUSCULAR | Status: DC

## 2014-01-19 SURGICAL SUPPLY — 23 items
BAG URINE DRAINAGE (UROLOGICAL SUPPLIES) ×2 IMPLANT
BAG URO CATCHER STRL LF (DRAPE) ×2 IMPLANT
CATH AINSWORTH 30CC 24FR (CATHETERS) IMPLANT
CATH FOLEY 3WAY 30CC 24FR (CATHETERS)
CATH HEMA 3WAY 30CC 22FR COUDE (CATHETERS) ×2 IMPLANT
CATH URO 16X24FR 3W FL PS (CATHETERS) IMPLANT
CLOTH BEACON ORANGE TIMEOUT ST (SAFETY) ×2 IMPLANT
DRAPE CAMERA CLOSED 9X96 (DRAPES) ×2 IMPLANT
ELECT BUTTON HF 24-28F 2 30DE (ELECTRODE) IMPLANT
ELECT LOOP MED HF 24F 12D (CUTTING LOOP) IMPLANT
ELECT LOOP MED HF 24F 12D CBL (CLIP) ×2 IMPLANT
ELECT RESECT VAPORIZE 12D CBL (ELECTRODE) IMPLANT
GLOVE BIOGEL M STRL SZ7.5 (GLOVE) ×2 IMPLANT
GOWN STRL REUS W/TWL LRG LVL3 (GOWN DISPOSABLE) ×2 IMPLANT
GOWN STRL REUS W/TWL XL LVL3 (GOWN DISPOSABLE) ×2 IMPLANT
HOLDER FOLEY CATH W/STRAP (MISCELLANEOUS) ×2 IMPLANT
KIT ASPIRATION TUBING (SET/KITS/TRAYS/PACK) ×2 IMPLANT
MANIFOLD NEPTUNE II (INSTRUMENTS) ×2 IMPLANT
NS IRRIG 1000ML POUR BTL (IV SOLUTION) ×2 IMPLANT
PACK CYSTO (CUSTOM PROCEDURE TRAY) ×2 IMPLANT
SYR 30ML LL (SYRINGE) ×2 IMPLANT
SYRINGE IRR TOOMEY STRL 70CC (SYRINGE) ×2 IMPLANT
TUBING CONNECTING 10 (TUBING) ×2 IMPLANT

## 2014-01-19 NOTE — Care Management Note (Signed)
    Page 1 of 1   01/19/2014     3:38:48 PM CARE MANAGEMENT NOTE 01/19/2014  Patient:  Derrick Andrews, Derrick Andrews   Account Number:  [id number]  Date Initiated:  01/19/2014  Documentation initiated by:  Dessa Phi  Subjective/Objective Assessment:   80 Y/O M ADMITTED W/PROSTATE CA.     Action/Plan:   FROM HOME W/DAUGHTER,HAS CANE,PCP,PHARMACY.   Anticipated DC Date:  01/20/2014   Anticipated DC Plan:  [place]  CM consult      Choice offered to / List presented to:             Status of service:  In process, will continue to follow Medicare Important Message given?   (If response is "NO", the following Medicare IM given date fields will be blank) Date Medicare IM given:   Medicare IM given by:   Date Additional Medicare IM given:   Additional Medicare IM given by:    Discharge Disposition:    Per UR Regulation:  Reviewed for med. necessity/level of care/duration of stay  If discussed at Long Length of Stay Meetings, dates discussed:    Comments:  01/19/14 Hermann Dottavio RN,BSN NCM [address] @ [place].SPOKE TO PATIENT/DAUGHTER-SUSAN  IN RM ABOUT INSURANCE.INFORMED THEM OF PRIMARY-BLUE MEDICARE,STATUS OUTPATIENT IN BED W/EXTENDED RECOVERY-WE ARE OBSERVING PATIENT'S CONDITION AFTER SURGERY.DAUGHTER VOICED UNDERSTANDING.IVF@52 ,CBI.CONTINUE TO MONITOR PROGRESS.

## 2014-01-19 NOTE — Progress Notes (Addendum)
INITIAL NUTRITION ASSESSMENT  DOCUMENTATION CODES Per approved criteria  -Not Applicable   INTERVENTION: -Recommend Boost Plus once daily -Provided pt and pt's family with nutrition supplement coupons -Encouraged to continue excellent PO intake -Will continue to monitor  NUTRITION DIAGNOSIS: Unintentional wt loss related to inadequate energy intake/chronic disease as evidenced by 16 lbs wt loss in one year.   Goal: Pt to meet >/= 90% of their estimated nutrition needs    Monitor:  Total protein/energy intake, labs, weights  Reason for Assessment: MST  80 y.o. male  Admitting Dx: <principal problem not specified>  ASSESSMENT: 80 yo male with hx of TCC of the prostatic ducts, and now with hematuria, and + FISH; and cysto findings of stone stuck on the right bladder neck and Right lateral prostate lobe, with apparent TCC of the bladder neck. He will need TURP again to stop bleeding, and remove stone and tumor ( hx of superficial TCC)  -Pt's family reported an unintentional wt loss of 16 lbs in past one year (8% body weight loss, non-significant for time frame) -Daughter assists in meal preparation. Diet recall indicate pt consuming cereal for breakfast, lunch typically sandwich or half sandwich w/soups, and dinner consists of meat, vegetables, and some dessert -Endorsed feelings of early satiety as pt's portion sizes of meals have also decreased in past year. Pt will drink Ensure or Boost occasionally.  -Pt currently on regular liberalized diet with excellent PO intake, 100% of meal Will order Boost Q24H as snack for nutrient replenishment and to assist in weight stabilization -Provided pt's family with nutrition supplement coupons to assist in complying with supplement regimen upon d/c  Height: Ht Readings from Last 1 Encounters:  01/19/14 6\' 1"  (1.854 m)    Weight: Wt Readings from Last 1 Encounters:  01/19/14 176 lb (79.833 kg)    Ideal Body Weight: 184 lbs  % Ideal  Body Weight: 96%  Wt Readings from Last 10 Encounters:  01/19/14 176 lb (79.833 kg)  01/19/14 176 lb (79.833 kg)  01/06/14 176 lb (79.833 kg)  12/29/13 176 lb (79.833 kg)  06/29/13 180 lb (81.647 kg)  03/15/12 187 lb (84.823 kg)  02/03/12 186 lb 9.6 oz (84.641 kg)  01/02/12 185 lb 9.6 oz (84.188 kg)  12/04/11 185 lb (83.915 kg)  11/04/11 184 lb (83.462 kg)    Usual Body Weight: 190-192 lbs  % Usual Body Weight: 92%  BMI:  Body mass index is 23.23 kg/(m^2).  Estimated Nutritional Needs: Kcal: 2000-2200 Protein: 80-95 gram Fluid: >/=2000 ml/daily  Skin: WDL  Diet Order: General  EDUCATION NEEDS: -No education needs identified at this time   Intake/Output Summary (Last 24 hours) at 01/19/14 1450 Last data filed at 01/19/14 1339  Gross per 24 hour  Intake   4190 ml  Output   2600 ml  Net   1590 ml    Last BM: 8/12   Labs:  No results found for this basename: NA, K, CL, CO2, BUN, CREATININE, CALCIUM, MG, PHOS, GLUCOSE,  in the last 168 hours  CBG (last 3)  No results found for this basename: GLUCAP,  in the last 72 hours  Scheduled Meds: . [START ON 01/20/2014] atenolol  25 mg Oral q morning - 10a  . ciprofloxacin  500 mg Oral BID  . [START ON 01/30/2014] cyanocobalamin  1,000 mcg Intramuscular Q30 days  . fentaNYL      . lactose free nutrition  237 mL Oral Q24H  . oxybutynin  5 mg Oral BID  . [  START ON 01/20/2014] pantoprazole  40 mg Oral Daily  . [START ON 01/20/2014] predniSONE  2 mg Oral Q breakfast  . senna-docusate  2 tablet Oral QHS  . simvastatin  5 mg Oral q1800  . [START ON 01/23/2014] Vitamin D (Ergocalciferol)  50,000 Units Oral Q7 days    Continuous Infusions: . dextrose 5 % and 0.45% NaCl 50 mL/hr at 01/19/14 1339  . sodium chloride irrigation      Past Medical History  Diagnosis Date  . Hyperlipidemia   . Coronary artery disease ? 2010    HEART STENTING - NO HEART ATTACK PER PT - JUST HAD CHEST TIGHTNESS - CARDIOLOGIST IS DR. Linard Millers  .  Cancer     Bladder and Prostate; 30 RADIATION TREATMENTS FOR PROSTATE CANCER - 3 OR 4 YRS AGO  . Urinary frequency     AND SOMETIMES CAN'T TELL WHEN URINE IS COMING - DEPENDS AS NEEDED  . GERD (gastroesophageal reflux disease)   . Arthritis     IN MY BACK  . Hearing problem of both ears     WEARS BILATERAL HEARING AIDS  . Blind left eye     DIAGNOSED WITH ? TEMPORAL ARTERITIS - AND PUT ON PREDNISONE TO PREVENT BLINDNESS IN RIGHT EYE  . Unsteady gait     USES CANE WHEN AMBULATING    Past Surgical History  Procedure Laterality Date  . Varicose veing stripping in 1950s    . Coronary angioplasty      HEART STENTING  . Tonsillectomy      Henning Trafalgar Clinical Dietitian [phone]

## 2014-01-19 NOTE — Anesthesia Postprocedure Evaluation (Signed)
  Anesthesia Post-op Note  Patient: Derrick Andrews  Procedure(s) Performed: Procedure(s) (LRB): TRANSURETHRAL RESECTION OF BLADDER CANCER INCASIVE INTO RIGHT PROSTATE (TURP) AND RESECTION OF SUPERFICIALBLADDER CANCER OF TRIGONE AND BLADDER NECK (N/A)  Patient Location: PACU  Anesthesia Type: General  Level of Consciousness: awake and alert   Airway and Oxygen Therapy: Patient Spontanous Breathing  Post-op Pain: mild  Post-op Assessment: Post-op Vital signs reviewed, Patient's Cardiovascular Status Stable, Respiratory Function Stable, Patent Airway and No signs of Nausea or vomiting  Last Vitals:  Filed Vitals:   01/19/14 1346  BP: 113/47  Pulse: 70  Temp: 36.1 C  Resp: 14    Post-op Vital Signs: stable   Complications: No apparent anesthesia complications

## 2014-01-19 NOTE — Interval H&P Note (Signed)
History and Physical Interval Note:  01/19/2014 7:50 AM  Derrick Andrews  has presented today for surgery, with the diagnosis of Prostate Cancer and Transitional Cell Carcinoma of Bladder Neck  The various methods of treatment have been discussed with the patient and family. After consideration of risks, benefits and other options for treatment, the patient has consented to  Procedure(s): TRANSURETHRAL RESECTION OF THE PROSTATE (TURP) (N/A) as a surgical intervention .  The patient's history has been reviewed, patient examined, no change in status, stable for surgery.  I have reviewed the patient's chart and labs.  Questions were answered to the patient's satisfaction.     Carolan Clines I

## 2014-01-19 NOTE — H&P (Signed)
Reason For Visit Cystoscopy   Active Problems Problems  1. Bladder cancer (188.9)   Assessed By: Carolan Clines (Urology); Last Assessed: 25 Oct 2013 2. 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 Microscopic hematuria (599.72)   Assessed By: Carolan Clines (Urology); Last Assessed: 13 Oct 2013 4. Prostate cancer (185)   Assessed By: Carolan Clines (Urology); Last Assessed: 25 Oct 2013 5. Urge incontinence of urine (788.31)   Assessed By: Carolan Clines (Urology); Last Assessed: 13 Oct 2013  History of Present Illness    80 yo male returns today for a cystoscopy. Hx of: 1) Bladder cancer. He last had TURP/TURBT April 2012 which showed low grade Ta TCC of the prostatic urothelium. disease. He had a CT scan on 08/16/10 which showed a probable exophytic cyst in the Lt renal mid pole measuring 1.3cm.    2) Prostate cancer in 2003 treated with EBRT in 2003 per Dr. Danny Lawless. PSA 0.74 in [facility], in 2008. On oxybuntinin for radiation cystitis and urge incontinence.       07/22/10 PSA - 4.53   Past Medical History Problems  1. History of Arthritis (V13.4) 2. History of cardiac disorder (V12.50)  Surgical History Problems  1. History of Cystoscopy With Biopsy 2. History of Cystoscopy With Fulguration 3. History of Radiation Therapy 4. History of Transurethral Resection Of Prostate (TURP)  Current Meds 1. Aspirin 81 MG Oral Tablet;  Therapy: (Recorded:01Apr2008) to Recorded 2. Atenolol 25 MG Oral Tablet;  Therapy: (Recorded:01Apr2008) to Recorded 3. B-12 TABS;  Therapy: (Recorded:09Apr2013) to Recorded 4. Fosamax Plus D 70-5600 MG-UNIT Oral Tablet;  Therapy: (Recorded:01Apr2008) to Recorded 5. Isosorbide Mononitrate ER 120 MG Oral Tablet Extended Release 24 Hour;  Therapy: (Recorded:07Mar2012) to Recorded 6. Oxybutynin Chloride 5 MG Oral Tablet;  Therapy: (Recorded:04May2012) to Recorded 7. PredniSONE TABS;  Therapy: (KGURKYHC:[id number]) to  Recorded 8. Simvastatin 20 MG Oral Tablet;  Therapy: (Recorded:01Apr2008) to Recorded 9. Uribel 118 MG Oral Capsule; TAKE 1 CAPSULE 3 times daily;  Therapy: [id number] to (Evaluate:18Jun2014); Last Rx:19May2014  Ordered  Allergies Medication  1. No Known Drug Allergies  Family History Problems  1. Family history of Family Health Status Number Of Children   2 DAUGHTERS  Social History Problems  1. Denied: Alcohol Use 2. Occupation:   RETIRED 3. Tobacco Use (V15.82)   LESS THAN A PACKQUIT 40 YEARS AGO  Review of Systems Genitourinary, constitutional, skin, otolaryngeal, hematologic/lymphatic, cardiovascular, pulmonary, endocrine, musculoskeletal, gastrointestinal and psychiatric system(s) were reviewed and pertinent findings if present are noted.  Genitourinary: urinary frequency, feelings of urinary urgency, dysuria and hematuria, but no nocturia, no incontinence, no difficulty starting the urinary stream, urine stream is not weak, urinary stream does not start and stop, no incomplete emptying of bladder, no post-void dribbling, no suprapubic pain and initiating urination does not require straining.  Gastrointestinal: no nausea and no vomiting.  Eyes: problems with eyesight.    Vitals Vital Signs [Data Includes: Last 1 Day]  Recorded: [id number] 02:57PM  Blood Pressure: 109 / 59 Temperature: 97.1 F Heart Rate: 70  Physical Exam Constitutional: Well nourished and well developed . No acute distress. The patient appears well hydrated.  ENT:. Hearing loss is noted.  Neck: The appearance of the neck is normal.  Pulmonary: No respiratory distress.  Cardiovascular:. No peripheral edema.  Abdomen: The abdomen is flat. The abdomen is soft and nontender. No masses are palpated. No CVA tenderness. No hernias are palpable. No hepatosplenomegaly noted.  Rectal: Rectal exam demonstrates normal sphincter tone, no tenderness and no masses. Estimated prostate size is 1+. Normal rectal  tone, no rectal masses, prostate is smooth, symmetric and non-tender. The prostate has no nodularity and is not tender. The left seminal vesicle is nonpalpable. The right seminal vesicle is nonpalpable. The perineum is normal on inspection.  Genitourinary: Examination of the penis demonstrates no discharge, no masses, no lesions and a normal meatus. The penis is uncircumcised. The scrotum is normal in appearance and without lesions. The right epididymis is palpably normal and non-tender. The left epididymis is palpably normal and non-tender. The right testis is atrophic, but without masses. The left testis is atrophic, but without masses.  Lymphatics: The femoral and inguinal nodes are not enlarged or tender.  Skin: Normal skin turgor, no visible rash and no visible skin lesions.  Neuro/Psych:. Mood and affect are appropriate.    Results/Data Selected Results  URINE CYTOLOGY W/REFLEX FISH [id number] 10:22AM Carolan Clines   Test Name Result Flag Reference  FINAL DIAGNOSIS:  A   - CELLS PRESENT SUSPICIOUS FOR MALIGNANCY. - ATYPICAL UROTHELIAL CELLS ARE PRESENT. RBC'S AND WBC'S ARE PRESENT. DUE TO THE DIAGNOSIS RENDERED ON THIS PATIENT WE REQUEST THAT THE CYTOLOGY LABORATORY BE PROVIDED WITH ADDITIONAL FOLLOW UP ON THE PATIENT WHEN AVAILABLE.  NUMBER OF SLIDES     1 Container Submitted  SOURCE:     Urine: Not Otherwise Specified  50CC OF YELLOW FLUID IN CUP 1 60CC OF YELLOW FLUID IN CUP 2 1 SLIDE PREPARED [id number]  Relevant Clinical Info     [3] BLADDER CANCER; IRRADIATION CYSTITIS; MICROSCOPIC HEMATURIA  CYTOTECHNOLOGIST:     GLM, BA CT(ASCP)  PATHOLOGIST:     REVIEWED BY S. SERDAR DEMIRCI, MD, (ELECTRONIC SIGNATURE ON FILE)   FISH(UroVysion) [id number] 10:22AM Gaynelle Arabian Abdo Denault  SPECIMEN TYPE: URINE   Test Name Result Flag Reference  FISH, UroVysion (R), Bladder Cancer REPORT    CYTOGENETIC RESULTS CYTOGENETIC REFERENCE #: [id number] TEST SETUP DATE: 10/15/2013 TEST  COMPLETION DATE: 10/18/2013 SPECIMEN SOURCE: URINE CLINICAL HISTORY:NOT PROVIDED INTERPHASE CELLS: >=25      METAPHASE CELLS: 0 FISH RESULTS: POSITIVE INTERPRETATION AND COMMENTS:     THIS RESULT IS INDICATIVE OF BLADDER CANCER ACCORDING TO THE VYSIS UROVYSION  DIRECTIONAL INSERT:  11 CELLS EXHIBITING POLYSOMY FOR CHROMOSOMES 3, 7, 9, AND 17.     POSITIVE UROVYSION  RESULTS IN THE ABSENCE OF OTHER SIGNS OR SYMPTOMS OF BLADDER CANCER MAY BE EVIDENCE OF OTHER URINARY TRACT RELATED CANCERS, FURTHER PATIENT FOLLOW-UP IS JUSTIFIED. IF THE TEST RESULTS ARE NOT CONSISTENT WITH THE CLINICAL FINDINGS, A CONSULTATION BETWEEN A PATHOLOGIST AND THE TREATING PHYSICIAN IS WARRANTED.     VYSIS UROVYSION  TEST: DESIGNED AS AN AID FOR THE INITIAL DIAGNOSIS OF BLADDER CARCINOMA IN PATIENTS WITH HEMATURIA AND SUBSEQUENT MONITORING FOR TUMOR RECURRENCE IN PATIENTS PREVIOUSLY DIAGNOSED WITH BLADDER CANCER.  FOR SAMPLES OTHER THAN VOIDED URINE, THE PERFORMANCE CHARACTERISTICS OF THIS TEST HAVE BEEN DETERMINED BY [facility]. PERFORMANCE CHARACTERISTICS REFER TO THE ANALYTICAL PERFORMANCE OF THE TEST. FOR A REVIEW OF THE PERFORMANCE CHARACTERISTICS OF THE UROVYSION ASSAY, SEE ADV ANAT PATHOL 2008;15:279-86.     FISH ISCN:  NUC ISH(D3Z1X3-8,D7Z1X3-6,CDKN2AX2-4,D17Z1X2-7)[11] ELECTRONIC SIGNATURE ON FILE _______________________________ Dwaine Deter, PH.D., [place], [phone]  Results Recieved 10/18/13    REFERENCE LAB ACCESSION: [id number] EC   Procedure  Procedure: Cystoscopy  Chaperone Present: Brandy.  Indication: Hematuria. Lower Urinary Tract Symptoms. History of Urothelial Carcinoma.  Informed Consent: Risks, benefits, and potential adverse events  were discussed and informed consent was obtained from the patient.  Prep: The patient was prepped with betadine.  Anesthesia:. Local anesthesia was administered intraurethrally with 2% lidocaine jelly.   Antibiotic prophylaxis: Ciprofloxacin.  Procedure Note:  Urethral meatus:. No abnormalities.  Anterior urethra: No abnormalities.  Prostatic urethra:. A tumor was identified on the right  . Stone formatio on tumor of Right side bladder neck.  Bladder: Visulization was clear. A systematic survey of the bladder demonstrated no bladder tumors or stones. The patient tolerated the procedure well.  Complications: None.    Assessment Assessed  1. Prostate cancer (185) 2. Bladder cancer (188.9)  80 yo male with hx of TCC of the prostatic ducts, and now with hematuria, and + FISH; and cysto findings of stone stuck on the right bladder neck and Right lateral prostate lobe, with apparent TCC of the bladder neck. He will need TURP again to stop bleeding, and remove stone and tumor ( hx of superficial TCC).   Plan TUR Bladder neck and Right side of prostate.   Signatures Electronically signed by : Carolan Clines, M.D.; Oct 25 2013  3:40PM EST

## 2014-01-19 NOTE — Anesthesia Preprocedure Evaluation (Addendum)
Anesthesia Evaluation  Patient identified by MRN, date of birth, ID band Patient awake    Reviewed: Allergy & Precautions, H&P , NPO status , Patient's Chart, lab work & pertinent test results  Airway Mallampati: II TM Distance: >3 FB Neck ROM: Full    Dental no notable dental hx.    Pulmonary neg pulmonary ROS, former smoker,  breath sounds clear to auscultation  Pulmonary exam normal       Cardiovascular + CAD and + Cardiac Stents Rhythm:Regular Rate:Normal  Stent 20years ago? No problems since.   Neuro/Psych negative neurological ROS  negative psych ROS   GI/Hepatic Neg liver ROS, GERD-  Medicated and Controlled,  Endo/Other  negative endocrine ROS  Renal/GU negative Renal ROS  negative genitourinary   Musculoskeletal negative musculoskeletal ROS (+)   Abdominal   Peds negative pediatric ROS (+)  Hematology negative hematology ROS (+)   Anesthesia Other Findings   Reproductive/Obstetrics negative OB ROS                          Anesthesia Physical Anesthesia Plan  ASA: II  Anesthesia Plan: General   Post-op Pain Management:    Induction: Intravenous  Airway Management Planned: LMA  Additional Equipment:   Intra-op Plan:   Post-operative Plan: Extubation in OR  Informed Consent: I have reviewed the patients History and Physical, chart, labs and discussed the procedure including the risks, benefits and alternatives for the proposed anesthesia with the patient or authorized representative who has indicated his/her understanding and acceptance.   Dental advisory given  Plan Discussed with: CRNA  Anesthesia Plan Comments:         Anesthesia Quick Evaluation

## 2014-01-19 NOTE — Transfer of Care (Signed)
Immediate Anesthesia Transfer of Care Note  Patient: Derrick Andrews  Procedure(s) Performed: Procedure(s): TRANSURETHRAL RESECTION OF BLADDER CANCER INCASIVE INTO RIGHT PROSTATE (TURP) AND RESECTION OF SUPERFICIALBLADDER CANCER OF TRIGONE AND BLADDER NECK (N/A)  Patient Location: PACU  Anesthesia Type:General  Level of Consciousness: awake and oriented  Airway & Oxygen Therapy: Patient Spontanous Breathing and Patient connected to face mask oxygen  Post-op Assessment: Report given to PACU RN and Post -op Vital signs reviewed and stable  Post vital signs: Reviewed and stable  Complications: No apparent anesthesia complications

## 2014-01-19 NOTE — Op Note (Signed)
Pre-operative diagnosis :   1. 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   Surgeon:  Chauncey Cruel. Gaynelle Arabian, MD  First assistant:  None Anesthesia:    General LMA  Preparation:  After appropriate preanesthesia, the patient is brought the operating room, placed on the operating table in the dorsal supine position where general LMA anesthesia was introduced. The patient was replaced in the dorsal lithotomy position with pubis was prepped with Betadine solution and draped in usual fashion. Armband was double checked. The history was double checked.  Review history:  Bladder cancer (188.9)   Assessed By: Carolan Clines (Urology); Last Assessed: 25 Oct 2013  2. 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 Microscopic hematuria (599.72)   Assessed By: Carolan Clines (Urology); Last Assessed: 13 Oct 2013  4. Prostate cancer (185)   Assessed By: Carolan Clines (Urology); Last Assessed: 25 Oct 2013  5. Urge incontinence of urine (788.31)   Assessed By: Carolan Clines (Urology); Last Assessed: 13 Oct 2013  History of Present Illness  80 yo male returns today for a cystoscopy. Hx of: 1) Bladder cancer. He last had TURP/TURBT April 2012 which showed low grade Ta TCC of the prostatic urothelium. disease. He had a CT scan on 08/16/10 which showed a probable exophytic cyst in the Lt renal mid pole measuring 1.3cm.  2) Prostate cancer in 2003 treated with EBRT in 2003 per Dr. Danny Lawless. PSA 0.74 in [facility], in 2008. On oxybuntinin for radiation cystitis and urge incontinence.    Statement of  Likelihood of Success: Excellent. TIME-OUT observed.:  Procedure:  Cystourethroscopy was was, and photodocumentation was accomplished of the patient's  stone formation on the right side of the prostate, with TCC of the prostatic mucosa noted as well. Resection was accomplished after the stone material was removed, and the clot removed. Resection was accomplished from the 12:00 to the 6 clock positions on the right side number removing all visible cancer. Extensive cauterization was accomplished for hematuria control. This tissue was sent to laboratory as specimen #1.  Cystoscopy revealed 5 cm of apparently low-grade TCC extending from the trigone to the bladder neck, right side greater than left. Photodocumentation of the ureteral orifices was identified. The transitional cell carcinoma was resected from the trigone to the bladder neck and sent to laboratory in specimen #2 container. Cauterization was accomplished. A size 22 French hematuria catheter was placed. The patient received IV Tylenol. He was awakened, and taken to recovery room in good condition.

## 2014-01-20 ENCOUNTER — Encounter (HOSPITAL_COMMUNITY): Payer: Self-pay | Admitting: Urology

## 2014-01-20 DIAGNOSIS — C679 Malignant neoplasm of bladder, unspecified: Secondary | ICD-10-CM | POA: Diagnosis not present

## 2014-01-20 MED ORDER — TRAMADOL-ACETAMINOPHEN 37.5-325 MG PO TABS: 1.0000 | ORAL_TABLET | Freq: Four times a day (QID) | ORAL | Status: DC | PRN

## 2014-01-20 MED ORDER — PHENAZOPYRIDINE HCL 200 MG PO TABS: 200.0000 mg | ORAL_TABLET | Freq: Three times a day (TID) | ORAL | Status: DC | PRN

## 2014-01-20 MED ORDER — ASPIRIN 81 MG PO TABS: 81.0000 mg | ORAL_TABLET | Freq: Every morning | ORAL | Status: DC

## 2014-01-20 MED ORDER — CIPROFLOXACIN HCL 500 MG PO TABS: 500.0000 mg | ORAL_TABLET | Freq: Two times a day (BID) | ORAL | Status: DC

## 2014-01-20 NOTE — Progress Notes (Signed)
1 Day Post-Op: TURP/TUR-BT Subjective: Patient reports pain R hip Rx single pain pill overnight.  Urine clear this am.  No spasm. .  Objective: Vital signs in last 24 hours: Temp:  [97 F (36.1 C)-98.4 F (36.9 C)] 97.3 F (36.3 C) (08/14 0556) Pulse Rate:  [62-77] 75 (08/14 0556) Resp:  [12-18] 18 (08/14 0556) BP: (102-147)/(47-63) 142/58 mmHg (08/14 0556) SpO2:  [94 %-100 %] 98 % (08/14 0556) Weight:  [79.833 kg (176 lb)] 79.833 kg (176 lb) (08/13 1115)  Intake/Output from previous day: 08/13 0701 - 08/14 0700 In: 20393.3 [P.O.:600; I.V.:2993.3] Out: [phone] [[id number]] Intake/Output this shift:    Physical Exam:  General:alert and cooperative GI: not done and soft, non tender, normal bowel sounds, no palpable masses, no organomegaly, no inguinal hernia Male genitalia: not done Bladder: no bladder distension noted Extremities: extremities normal, atraumatic, no cyanosis or edema  Lab Results: No results found for this basename: HGB, HCT,  in the last 72 hours BMET No results found for this basename: NA, K, CL, CO2, GLUCOSE, BUN, CREATININE, CALCIUM,  in the last 72 hours No results found for this basename: LABPT, INR,  in the last 72 hours No results found for this basename: LABURIN,  in the last 72 hours Results for orders placed in visit on 02/25/11  SURGICAL PCR SCREEN     Status: None   Collection Time    02/25/11 10:10 AM      Result Value Ref Range Status   MRSA, PCR NEGATIVE  NEGATIVE Final   Staphylococcus aureus NEGATIVE  NEGATIVE Final   Comment:            The Xpert SA Assay (FDA     approved for NASAL specimens     only), is one component of     a comprehensive surveillance     program.  It is not intended     to diagnose infection nor to     guide or monitor treatment.    Studies/Results: No results found.  Assessment/Plan: POD #1. Pt has clear urine on minimal CBI. Will allow him to have CBI d/c, and discharge to home with catheter to  straight drainage. RTC Monday AM for voiding trial.  Pathology pending.  Discussed yesterday with daughter ( sole caregiver) and this AM with patient.    LOS: 1 day   Jacquelina Hewins I 01/20/2014, 8:46 AM

## 2014-01-20 NOTE — Progress Notes (Signed)
Discharge instructions given to patient and daughters, teach back obtained for emptying catheter bag and changing to leg bag. Patient denies pain at this time, denies any additional questions or needs. Patient escorted via wheelchair to car.

## 2014-01-20 NOTE — Discharge Instructions (Addendum)
Bladder Cancer Bladder cancer is an abnormal growth of tissue in your bladder. Your bladder is the balloon-like sac in your pelvis. It collects and stores urine that comes from the kidneys through the ureters. The bladder wall is made of layers. If cancer spreads into these layers and through the wall of the bladder, it becomes more difficult to treat.  There are four stages of bladder cancer:  Stage I. Cancer at this stage occurs in the bladder's inner lining but has not invaded the muscular bladder wall.  Stage II. At this stage, cancer has invaded the bladder wall but is still confined to the bladder.  Stage III. By this stage, the cancer cells have spread through the bladder wall to surrounding tissue. They may also have spread to the prostate in men or the uterus or vagina in women.  Stage IV. By this stage, cancer cells may have spread to the lymph nodes and other organs, such as your lungs, bones, or liver. RISK FACTORS Although the cause of bladder cancer is not known, the following risk factors can increase your chances of getting bladder cancer:   Smoking.   Occupational exposures, such as rubber, leather, textile, dyes, chemicals, and paint.  Being white.  Age.   Being male.   Having chronic bladder inflammation.   Having a bladder cancer history.   Having a family history of bladder cancer (heredity).   Having had chemotherapy or radiation therapy to the pelvis.   Being exposed to arsenic.  SYMPTOMS   Blood in the urine.   Pain with urination.   Frequent bladder or urine infections.  Increase in urgency and frequency of urination. DIAGNOSIS  Your health care provider may suspect bladder cancer based on your description of urinary symptoms or based on the finding of blood or infection in the urine (especially if this has recurred several times). Other tests or procedures that may be performed include:   A narrow tube being inserted into your bladder  through your urethra (cystoscopy) in order to view the lining of your bladder for tumors.   A biopsy to sample the tumor to see if cancer is present.  If cancer is present, it will then be staged to determine its severity and extent. It is important to know how deeply into the bladder wall the cancer has grown and whether the cancer has spread to any other parts of your body. Staging may require blood tests or special scans such as a CT scan, MRI, bone scan, or chest X-ray.  TREATMENT  Once your cancer has been diagnosed and staged, you should discuss a treatment plan with your health care provider. Based on the stage of the cancer, one treatment or a combination of treatments may be recommended. The most common forms of treatment are:   Surgery. Procedures that may be done include transurethral resection and cystectomy.  Radiation therapy. This is infrequently used to treat bladder cancer.   Chemotherapy. During this treatment, drugs are used to kill cancer cells.  Immunotherapy. This is usually administered directly into the bladder. HOME CARE INSTRUCTIONS  Take medicines only as directed by your health care provider.   Maintain a healthy diet.   Consider joining a support group. This may help you learn to cope with the stress of having bladder cancer.   Seek advice to help you manage treatment side effects.   Keep all follow-up visits as directed by your health care provider.   Inform your cancer specialist if you are  admitted to the hospital.  [place] IF:  There is blood in your urine.  You have symptoms of a urinary tract infection. These include:  Tiredness.  Shakiness.  Weakness.  Muscle aches.  Abdominal pain.  Frequent and intense urge to urinate (in young women).  Burning feeling in the bladder or urethra during urination (in young women). SEEK IMMEDIATE MEDICAL CARE IF:  You are unable to urinate. Document Released: 05/29/2003 Document  Revised: 10/10/2013 Document Reviewed: 11/16/2012 [facility] Patient Information 2015 [place], [place]. This information is not intended to replace advice given to you by your health care provider. Make sure you discuss any questions you have with your health care provider. Post transurethral resection of the prostate (TURP) instructions  Your recent prostate surgery requires very special post hospital care. Despite the fact that no skin incisions were used the area around the prostate incision is quite raw and is covered with a scab to promote healing and prevent bleeding. Certain cautions are needed to assure that the scab is not disturbed of the next 2-3 weeks while the healing proceeds.  Because the raw surface in your prostate and the irritating effects of urine you may expect frequency of urination and/or urgency (a stronger desire to urinate) and perhaps even getting up at night more often. This will usually resolve or improve slowly over the healing period. You may see some blood in your urine over the first 6 weeks. Do not be alarmed, even if the urine was clear for a while. Get off your feet and drink lots of fluids until clearing occurs. If you start to pass clots or don't improve call us.  Diet:  You may return to your normal diet immediately. Because of the raw surface of your bladder, alcohol, spicy foods, foods high in acid and drinks with caffeine may cause irritation or frequency and should be used in moderation. To keep your urine flowing freely and avoid constipation, drink plenty of fluids during the day (8-10 glasses). Tip: Avoid cranberry juice because it is very acidic.  Activity:  Your physical activity doesn't need to be restricted. However, if you are very active, you may see some blood in the urine. We suggest that you reduce your activity under the circumstances until the bleeding has stopped.  Bowels:  It is important to keep your bowels regular during the postoperative  period. Straining with bowel movements can cause bleeding. A bowel movement every other day is reasonable. Use a mild laxative if needed, such as milk of magnesia 2-3 tablespoons, or 2 Dulcolax tablets. Call if you continue to have problems. If you had been taking narcotics for pain, before, during or after your surgery, you may be constipated. Take a laxative if necessary.  Medication:  You should resume your pre-surgery medications unless told not to. In addition you may be given an antibiotic to prevent or treat infection. Antibiotics are not always necessary. All medication should be taken as prescribed until the bottles are finished unless you are having an unusual reaction to one of the drugs.     Problems you should report to [place]:  a. Fever greater than 101F. b. Heavy bleeding, or clots (see notes above about blood in urine). c. Inability to urinate. d. Drug reactions (hives, rash, nausea, vomiting, diarrhea). e. Severe burning or pain with urination that is not improving.

## 2014-01-20 NOTE — Discharge Summary (Signed)
  Physician Discharge Summary  Patient ID: FACUNDO ALLEMAND MRN: [id number] DOB/AGE: [birth year] 78 y.o.  Admit date: 01/19/2014 Discharge date: 01/20/2014  Admission Diagnoses: Prostate Cancer and Transitional Cell Carcinoma of Bladder Neck  Discharge Diagnoses:  Active Problems:   Cancer, bladder, neck   Discharged Condition: good  Hospital Course:  TURP and TUR-BT 01/19/14  Consults: none  Significant Diagnostic Studies: No results found.  Treatments: IV hydration  Discharge Exam: Blood pressure 142/58, pulse 75, temperature 97.3 F (36.3 C), temperature source Oral, resp. rate 18, height 6\' 1"  (1.854 m), weight 79.833 kg (176 lb), SpO2 98.00%. General appearance: alert and cooperative  Disposition: 01-Home or Self Care  Discharge Instructions   Continue foley catheter    Complete by:  As directed      Discontinue IV    Complete by:  As directed             Medication List         aspirin 81 MG tablet  Take 1 tablet (81 mg total) by mouth every morning.     atenolol 25 MG tablet  Commonly known as:  TENORMIN  Take 25 mg by mouth every morning.     ciprofloxacin 500 MG tablet  Commonly known as:  CIPRO  Take 1 tablet (500 mg total) by mouth 2 (two) times daily.     cyanocobalamin 1000 MCG/ML injection  Commonly known as:  (VITAMIN B-12)  Inject 1,000 mcg into the muscle every 30 (thirty) days.     nitroGLYCERIN 0.4 MG SL tablet  Commonly known as:  NITROSTAT  Place 0.4 mg under the tongue every 5 (five) minutes as needed for chest pain.     omeprazole 20 MG capsule  Commonly known as:  PRILOSEC  Take 20 mg by mouth daily.     oxybutynin 5 MG tablet  Commonly known as:  DITROPAN  Take 5 mg by mouth 2 (two) times daily.     phenazopyridine 200 MG tablet  Commonly known as:  PYRIDIUM  Take 1 tablet (200 mg total) by mouth 3 (three) times daily as needed for pain.     pravastatin 40 MG tablet  Commonly known as:  PRAVACHOL  Take 40 mg by mouth at  bedtime.     predniSONE 1 MG tablet  Commonly known as:  DELTASONE  Take 2 mg by mouth every morning.     traMADol-acetaminophen 37.5-325 MG per tablet  Commonly known as:  ULTRACET  Take 1 tablet by mouth every 6 (six) hours as needed.     Vitamin D (Ergocalciferol) 50000 UNITS Caps capsule  Commonly known as:  DRISDOL  Take 50,000 Units by mouth every 7 (seven) days.           Follow-up Information   Follow up with Carolan Clines I, MD. Schedule an appointment as soon as possible for a visit on 01/23/2014. (for catheter removal and voiding trial)    Specialty:  Urology   Contact information:   [address] [place] [address] [phone]     Home with foley cath to straight drain.  RTC Monday for foley removal.   Signed: Gaynelle Arabian, Janin Kozlowski I 01/20/2014, 8:55 AM

## 2014-02-28 ENCOUNTER — Encounter (HOSPITAL_COMMUNITY): Payer: Self-pay | Admitting: Urology

## 2014-04-01 ENCOUNTER — Emergency Department (HOSPITAL_COMMUNITY): Payer: Medicare Other

## 2014-04-01 ENCOUNTER — Encounter (HOSPITAL_COMMUNITY): Payer: Self-pay | Admitting: Emergency Medicine

## 2014-04-01 ENCOUNTER — Emergency Department (HOSPITAL_COMMUNITY)
Admission: EM | Admit: 2014-04-01 | Discharge: 2014-04-01 | Discharge status: Against Medical Advice | Payer: Medicare Other | Attending: Emergency Medicine | Admitting: Emergency Medicine

## 2014-04-01 DIAGNOSIS — Z8669 Personal history of other diseases of the nervous system and sense organs: Secondary | ICD-10-CM | POA: Insufficient documentation

## 2014-04-01 DIAGNOSIS — Z7982 Long term (current) use of aspirin: Secondary | ICD-10-CM | POA: Diagnosis not present

## 2014-04-01 DIAGNOSIS — Z7952 Long term (current) use of systemic steroids: Secondary | ICD-10-CM | POA: Insufficient documentation

## 2014-04-01 DIAGNOSIS — Z8546 Personal history of malignant neoplasm of prostate: Secondary | ICD-10-CM | POA: Diagnosis not present

## 2014-04-01 DIAGNOSIS — M1990 Unspecified osteoarthritis, unspecified site: Secondary | ICD-10-CM | POA: Insufficient documentation

## 2014-04-01 DIAGNOSIS — Z87891 Personal history of nicotine dependence: Secondary | ICD-10-CM | POA: Insufficient documentation

## 2014-04-01 DIAGNOSIS — K219 Gastro-esophageal reflux disease without esophagitis: Secondary | ICD-10-CM | POA: Diagnosis not present

## 2014-04-01 DIAGNOSIS — I2510 Atherosclerotic heart disease of native coronary artery without angina pectoris: Secondary | ICD-10-CM | POA: Insufficient documentation

## 2014-04-01 DIAGNOSIS — Z8551 Personal history of malignant neoplasm of bladder: Secondary | ICD-10-CM | POA: Diagnosis not present

## 2014-04-01 DIAGNOSIS — E785 Hyperlipidemia, unspecified: Secondary | ICD-10-CM | POA: Insufficient documentation

## 2014-04-01 DIAGNOSIS — Z9861 Coronary angioplasty status: Secondary | ICD-10-CM | POA: Diagnosis not present

## 2014-04-01 DIAGNOSIS — Z79899 Other long term (current) drug therapy: Secondary | ICD-10-CM | POA: Diagnosis not present

## 2014-04-01 DIAGNOSIS — R079 Chest pain, unspecified: Secondary | ICD-10-CM

## 2014-04-01 LAB — CBC
HCT: 44.6 % (ref 39.0–52.0)
Hemoglobin: 14.9 g/dL (ref 13.0–17.0)
MCH: 33.3 pg (ref 26.0–34.0)
MCHC: 33.4 g/dL (ref 30.0–36.0)
MCV: 99.8 fL (ref 78.0–100.0)
Platelets: 173 10*3/uL (ref 150–400)
RBC: 4.47 MIL/uL (ref 4.22–5.81)
RDW: 14.1 % (ref 11.5–15.5)
WBC: 8.4 10*3/uL (ref 4.0–10.5)

## 2014-04-01 LAB — BASIC METABOLIC PANEL
ANION GAP: 11 (ref 5–15)
BUN: 20 mg/dL (ref 6–23)
CO2: 27 meq/L (ref 19–32)
Calcium: 8.9 mg/dL (ref 8.4–10.5)
Chloride: 99 mEq/L (ref 96–112)
Creatinine, Ser: 0.85 mg/dL (ref 0.50–1.35)
GFR calc Af Amer: 84 mL/min — ABNORMAL LOW (ref >90)
GFR calc non Af Amer: 72 mL/min — ABNORMAL LOW (ref >90)
Glucose, Bld: 115 mg/dL — ABNORMAL HIGH (ref 70–99)
Potassium: 4.3 mEq/L (ref 3.7–5.3)
SODIUM: 137 meq/L (ref 137–147)

## 2014-04-01 LAB — I-STAT TROPONIN, ED: TROPONIN I, POC: 0.02 ng/mL (ref 0.00–0.08)

## 2014-04-01 MED ORDER — ASPIRIN 325 MG PO TABS
325.0000 mg | ORAL_TABLET | ORAL | Status: AC
  Administered 2014-04-01: 325 mg via ORAL
  Filled 2014-04-01: qty 1

## 2014-04-01 MED ORDER — NITROGLYCERIN 0.4 MG SL SUBL: 0.4000 mg | SUBLINGUAL_TABLET | SUBLINGUAL | Status: AC | PRN

## 2014-04-01 MED ORDER — NITROGLYCERIN 0.4 MG SL SUBL
0.4000 mg | SUBLINGUAL_TABLET | SUBLINGUAL | Status: DC | PRN
  Filled 2014-04-01: qty 1

## 2014-04-01 NOTE — ED Provider Notes (Signed)
CSN: [id number]     Arrival date & time 04/01/14  4696 History   First MD Initiated Contact with Patient 04/01/14 [phone]     Chief Complaint  Patient presents with  . Chest Pain      HPI Pt presents with chest pain starting at 0300, took nitro sub ling that is over 78 years old. Central chest and left side, tightness in chest, pt denies previous history of chest pain.  Patient has history of cardiac stenting several years ago.  He has a total of 3 stents.  Patient denies nausea, vomiting, diaphoresis during the events.  Past Medical History  Diagnosis Date  . Hyperlipidemia   . Coronary artery disease ? 2010    HEART STENTING - NO HEART ATTACK PER PT - JUST HAD CHEST TIGHTNESS - CARDIOLOGIST IS DR. Linard Millers  . Cancer     Bladder and Prostate; 30 RADIATION TREATMENTS FOR PROSTATE CANCER - 3 OR 4 YRS AGO  . Urinary frequency     AND SOMETIMES CAN'T TELL WHEN URINE IS COMING - DEPENDS AS NEEDED  . GERD (gastroesophageal reflux disease)   . Arthritis     IN MY BACK  . Hearing problem of both ears     WEARS BILATERAL HEARING AIDS  . Blind left eye     DIAGNOSED WITH ? TEMPORAL ARTERITIS - AND PUT ON PREDNISONE TO PREVENT BLINDNESS IN RIGHT EYE  . Unsteady gait     USES CANE WHEN AMBULATING   Past Surgical History  Procedure Laterality Date  . Varicose veing stripping in 1950s    . Coronary angioplasty      HEART STENTING  . Tonsillectomy    . Transurethral resection of prostate N/A 01/19/2014    Procedure: TRANSURETHRAL RESECTION OF BLADDER CANCER INCASIVE INTO RIGHT PROSTATE (TURP) AND RESECTION OF SUPERFICIALBLADDER CANCER OF TRIGONE AND BLADDER NECK;  Surgeon: Ailene Rud, MD;  Location: [facility];  Service: Urology;  Laterality: N/A;   No family history on file. History  Substance Use Topics  . Smoking status: Former Research scientist (life sciences)  . Smokeless tobacco: Never Used  . Alcohol Use: Yes     Comment: occasional    Review of Systems  All other systems reviewed and are  negative  Allergies  Mercury ammoniated  Home Medications   Prior to Admission medications   Medication Sig Start Date End Date Taking? Authorizing Provider  aspirin 325 MG tablet Take 162.5 mg by mouth daily.   Yes Historical Provider, MD  calcium carbonate (TUMS - DOSED IN MG ELEMENTAL CALCIUM) 500 MG chewable tablet Chew 1 tablet by mouth daily.   Yes Historical Provider, MD  cyanocobalamin (,VITAMIN B-12,) 1000 MCG/ML injection Inject 1,000 mcg into the muscle every 30 (thirty) days.   Yes Historical Provider, MD  Dextromethorphan-Guaifenesin (ROBITUSSIN PEAK COLD DM PO) Take 5-10 mLs by mouth every 4 (four) hours as needed (cough/cold).   Yes Historical Provider, MD  isosorbide mononitrate (IMDUR) 120 MG 24 hr tablet Take 120 mg by mouth daily.   Yes Historical Provider, MD  nitroGLYCERIN (NITROSTAT) 0.4 MG SL tablet Place 0.4 mg under the tongue every 5 (five) minutes as needed for chest pain.   Yes Historical Provider, MD  omeprazole (PRILOSEC) 20 MG capsule Take 20 mg by mouth daily.   Yes Historical Provider, MD  oxybutynin (DITROPAN) 5 MG tablet Take 5 mg by mouth 2 (two) times daily.   Yes Historical Provider, MD  pravastatin (PRAVACHOL) 40 MG tablet Take 40 mg  by mouth at bedtime.    Yes Historical Provider, MD  predniSONE (DELTASONE) 1 MG tablet Take 2 mg by mouth every morning.    Yes Historical Provider, MD  Vitamin D, Ergocalciferol, (DRISDOL) 50000 UNITS CAPS capsule Take 50,000 Units by mouth every 7 (seven) days.   Yes Historical Provider, MD  nitroGLYCERIN (NITROSTAT) 0.4 MG SL tablet Place 1 tablet (0.4 mg total) under the tongue every 5 (five) minutes as needed for chest pain. 04/01/14   Dot Lanes, MD   BP 119/77  Pulse 67  Temp(Src) 97.7 F (36.5 C) (Oral)  Resp 16  SpO2 98% Physical Exam Physical Exam  Nursing note and vitals reviewed. Constitutional: He is oriented to person, place, and time. He appears well-developed and well-nourished. No distress.   HENT:  Head: Normocephalic and atraumatic.  Neck: Normal range of motion.  Cardiovascular: Normal rate and intact distal pulses.   Pulmonary/Chest: No respiratory distress.  Abdominal: Normal appearance. He exhibits no distension.  Musculoskeletal: Normal range of motion.  Neurological: He is alert and oriented to person, place, and time. No cranial nerve deficit.  Skin: Skin is warm and dry. No rash noted.  Psychiatric: He has a normal mood and affect. His behavior is normal.   ED Course  Procedures (including critical care time) Medications  nitroGLYCERIN (NITROSTAT) SL tablet 0.4 mg (not administered)  aspirin tablet 325 mg (325 mg Oral Given 04/01/14 0949)    Labs Review Labs Reviewed  BASIC METABOLIC PANEL - Abnormal; Notable for the following:    Glucose, Bld 115 (*)    GFR calc non Af Amer 72 (*)    GFR calc Af Amer 84 (*)    All other components within normal limits  CBC  I-STAT TROPOININ, ED    Imaging Review Dg Chest Port 1 View  04/01/2014   CLINICAL DATA:  Middle chest pain this morning, initial evaluation, history of coronary artery disease with 3 stents, personal history of bladder and prostate cancer, former smoker  EXAM: PORTABLE CHEST - 1 VIEW  COMPARISON:  01/06/2014  FINDINGS: Heart size and vascular pattern normal. Moderate diffuse interstitial prominence, similar to prior study suggesting chronic interstitial lung disease. No consolidation or effusion.  IMPRESSION: Stable chronic interstitial lung disease   Electronically Signed   By: Skipper Cliche M.D.   On: 04/01/2014 10:37     EKG Interpretation   Date/Time:  Saturday April 01 2014 09:51:07 EDT Ventricular Rate:  77 PR Interval:  206 QRS Duration: 91 QT Interval:  393 QTC Calculation: 445 R Axis:   30 Text Interpretation:  Sinus rhythm Normal ECG Confirmed by Cher Franzoni  MD,  Deanthony Maull ([id number]) on 04/01/2014 9:54:15 AM     I discussed the case with cardiology who recommended addition the  hospitalist service with a cardiology consult as needed.  After discussing with the patient he was adamant about not wanting to be admitted.  He signed papers to leave [place].  Patient had capacity to make his own decisions.  He was invited to return should his symptoms return or should he change his mind for any reason.  He was instructed that leaving his medical device could result in serious outcomes including the possibility of sudden death.  He understands this. MDM   Final diagnoses:  Chest pain, unspecified chest pain type        Dot Lanes, MD 04/01/14 1222

## 2014-04-01 NOTE — Discharge Instructions (Signed)
Increase your omeprazole to twice daily for one week   Chest Pain (Nonspecific) It is often hard to give a diagnosis for the cause of chest pain. There is always a chance that your pain could be related to something serious, such as a heart attack or a blood clot in the lungs. You need to follow up with your doctor. HOME CARE  If antibiotic medicine was given, take it as directed by your doctor. Finish the medicine even if you start to feel better.  For the next few days, avoid activities that bring on chest pain. Continue physical activities as told by your doctor.  Do not use any tobacco products. This includes cigarettes, chewing tobacco, and e-cigarettes.  Avoid drinking alcohol.  Only take medicine as told by your doctor.  Follow your doctor's suggestions for more testing if your chest pain does not go away.  Keep all doctor visits you made. GET HELP IF:  Your chest pain does not go away, even after treatment.  You have a rash with blisters on your chest.  You have a fever. GET HELP RIGHT AWAY IF:   You have more pain or pain that spreads to your arm, neck, jaw, back, or belly (abdomen).  You have shortness of breath.  You cough more than usual or cough up blood.  You have very bad back or belly pain.  You feel sick to your stomach (nauseous) or throw up (vomit).  You have very bad weakness.  You pass out (faint).  You have chills. This is an emergency. Do not wait to see if the problems will go away. Call your local emergency services (911 in [place] Do not drive yourself to the hospital. MAKE SURE YOU:   Understand these instructions.  Will watch your condition.  Will get help right away if you are not doing well or get worse. Document Released: 11/12/2007 Document Revised: 05/31/2013 Document Reviewed: 11/12/2007 [facility] Patient Information 2015 [place], [place]. This information is not intended to replace advice given to you by your health care provider.  Make sure you discuss any questions you have with your health care provider.

## 2014-04-01 NOTE — ED Notes (Signed)
Pt leaving against medical advise, pt aware that MDs advise him to stay in emergency department. Pt states he wants to leave.

## 2014-04-01 NOTE — ED Notes (Signed)
Pt presents with chest pain starting at 0300, took nitro sub ling that is over 78 years old. Central chest and left side, tightness in chest, pt denies previous history of chest pain

## 2014-04-06 ENCOUNTER — Encounter: Payer: Self-pay | Admitting: Physician Assistant

## 2014-04-06 ENCOUNTER — Ambulatory Visit (INDEPENDENT_AMBULATORY_CARE_PROVIDER_SITE_OTHER): Payer: Medicare Other | Admitting: Physician Assistant

## 2014-04-06 VITALS — BP 120/78 | HR 90 | Ht 72.0 in | Wt 178.8 lb

## 2014-04-06 DIAGNOSIS — M316 Other giant cell arteritis: Secondary | ICD-10-CM | POA: Insufficient documentation

## 2014-04-06 DIAGNOSIS — E785 Hyperlipidemia, unspecified: Secondary | ICD-10-CM

## 2014-04-06 DIAGNOSIS — K219 Gastro-esophageal reflux disease without esophagitis: Secondary | ICD-10-CM | POA: Insufficient documentation

## 2014-04-06 DIAGNOSIS — I25119 Atherosclerotic heart disease of native coronary artery with unspecified angina pectoris: Secondary | ICD-10-CM

## 2014-04-06 DIAGNOSIS — I2510 Atherosclerotic heart disease of native coronary artery without angina pectoris: Secondary | ICD-10-CM | POA: Insufficient documentation

## 2014-04-06 NOTE — Progress Notes (Signed)
Derrick Andrews, Derrick Andrews, [place]  [address] Phone: [phone] Fax:  [phone]  Date:  04/06/2014   Patient ID:  Derrick Andrews, Derrick Andrews Jan 15, 1918, MRN [id number]   PCP:  Horton Finer, MD  Cardiologist: Tamala Julian  History of Present Illness: Derrick Andrews is a 80 y.o. male with history of CAD s/p LAD stent 2003 (last cath 2005 - mild-mod ISR 50-60, otherwise patent cors with heavy calcification, normal EF), prostate cancer/transitional cell bladder cancer s/p radiation and surgery, HLD, temporal arteritis, and unsteady gait (uses cane) who presents for follow-up. He was our 3:15pm patient but came early so was added into the morning schedule.  He was seen in the ER 04/01/14 for an episode of chest pain. This woke him up in the middle of the night and felt like a central discomfort but no other associated symptoms. He took 1 SL NTG and went back to sleep. He's not sure how long it lasted because he went to sleep shortly after it started. When he woke up, he was pain free but decided to seek care in the ER to be checked out. BMET unremarkable except sugar 115, CBC wnl, troponin negative x 1. EKG was nonacute. Admission was recommended for observation but he declined and left AMA.  He has not had any further chest pain. No dyspnea, nausea, vomiting, presyncope or syncope. He ambulates with a cane and does not exert himself much. He lives with his daughter. She cooks him dinner but he makes his own breakfast and lunch. He says he feels good today without complaint.  Recent Labs: 06/29/2013: Pro B Natriuretic peptide (BNP) 79.0  04/01/2014: Creatinine 0.85; Hemoglobin 14.9; Potassium 4.3   Wt Readings from Last 3 Encounters:  04/06/14 178 lb 12.8 oz (81.103 kg)  01/19/14 176 lb (79.833 kg)  01/19/14 176 lb (79.833 kg)     Past Medical History  Diagnosis Date  . Hyperlipidemia   . Coronary artery disease ? 2010    a. hx LAD stent in 2003.  Marland Kitchen Cancer     a. Prostate Cancer  and Transitional Cell Carcinoma of Bladder Neck s/p radiation, surgery.  . Urinary frequency     AND SOMETIMES CAN'T TELL WHEN URINE IS COMING - DEPENDS AS NEEDED  . GERD (gastroesophageal reflux disease)   . Arthritis     IN MY BACK  . Hearing problem of both ears     WEARS BILATERAL HEARING AIDS  . Blind left eye     DIAGNOSED WITH ? TEMPORAL ARTERITIS - AND PUT ON PREDNISONE TO PREVENT BLINDNESS IN RIGHT EYE  . Unsteady gait     USES CANE WHEN AMBULATING  . Temporal arteritis     a. Biopsy proven per records.    Current Outpatient Prescriptions  Medication Sig Dispense Refill  . aspirin 325 MG tablet Take 162.5 mg by mouth daily.      . calcium carbonate (TUMS - DOSED IN MG ELEMENTAL CALCIUM) 500 MG chewable tablet Chew 1 tablet by mouth daily.      . cyanocobalamin (,VITAMIN B-12,) 1000 MCG/ML injection Inject 1,000 mcg into the muscle every 30 (thirty) days.      . Dextromethorphan-Guaifenesin (ROBITUSSIN PEAK COLD DM PO) Take 5-10 mLs by mouth every 4 (four) hours as needed (cough/cold).      . isosorbide mononitrate (IMDUR) 120 MG 24 hr tablet Take 120 mg by mouth daily.      . nitroGLYCERIN (NITROSTAT) 0.4 MG SL tablet Place  1 tablet (0.4 mg total) under the tongue every 5 (five) minutes as needed for chest pain.  60 tablet  0  . omeprazole (PRILOSEC) 20 MG capsule Take 20 mg by mouth daily.      Marland Kitchen oxybutynin (DITROPAN) 5 MG tablet Take 5 mg by mouth 2 (two) times daily.      . pravastatin (PRAVACHOL) 40 MG tablet Take 40 mg by mouth at bedtime.       . predniSONE (DELTASONE) 1 MG tablet Take 2 mg by mouth every morning.       . Vitamin D, Ergocalciferol, (DRISDOL) 50000 UNITS CAPS capsule Take 50,000 Units by mouth every 7 (seven) days.       No current facility-administered medications for this visit.    Allergies:   Mercury ammoniated   Social History:  The patient  reports that he has quit smoking. He has never used smokeless tobacco. He reports that he drinks alcohol.  He reports that he does not use illicit drugs.   Family History:  The patient's family history includes CAD in his father.   ROS:  Please see the history of present illness.   All other systems reviewed and negative.   PHYSICAL EXAM:  VS:  BP 120/78  Pulse 90  Ht 6' (1.829 m)  Wt 178 lb 12.8 oz (81.103 kg)  BMI 24.24 kg/m2 - pulse recheck sitting was 1 Well nourished, well developed elderly WM, kyphotic posture, in no acute distress HEENT: normal Neck: no JVD Cardiac:  normal S1, S2; RRR; no murmur Lungs:  clear to auscultation bilaterally, no wheezing, rhonchi or rales Abd: soft, nontender, no hepatomegaly Ext: no edema Skin: warm and dry Neuro:  moves all extremities spontaneously, no focal abnormalities noted  EKG:  NSR 98bpm, more prominent T waves in lead III, nonspecific J point elvation in II, avF - stable compared to ED EKG, and less TW changes compared to 06/2013 EKG  ASSESSMENT AND PLAN:  1. CAD with recent chest pain - differential includes angina, GERD, esophageal spasm or other GI etiology. Episode was transient and has not recurred. He has been able to walk without chest discomfort. Given advanced age and lack of objective evidence of ischemia, will continue current regimen and observe for recurrent symptoms. Initial BP today was 98/70 with recheck by me at 120/78 - will not titrate antianginal therapy at this time since BP was initially somewhat soft, but we can consider if discomfort recurs. This would include likely addition of low dose BB although I am not sure if his BP can tolerate that right now. Continue aspirin, Imdur, statin. Warning signs reviewed. 2. Hyperlipidemia - continue statin. 3. GERD - continue PPI.  Dispo: F/u Dr. Tamala Julian in 4-6 weeks.  Signed, Melina Copa, PA-C  04/06/2014 11:46 AM

## 2014-04-06 NOTE — Patient Instructions (Signed)
Your physician recommends that you continue on your current medications as directed. Please refer to the Current Medication list given to you today.   Your physician recommends that you schedule a follow-up appointment in:  IN 4 TO 6 WEEKS WITH  OR NEXT AVAILABLE APPOINTMENT  WITH  DR Hepzibah

## 2014-04-21 ENCOUNTER — Ambulatory Visit: Payer: Medicare Other | Admitting: Interventional Cardiology

## 2014-05-15 ENCOUNTER — Ambulatory Visit (INDEPENDENT_AMBULATORY_CARE_PROVIDER_SITE_OTHER): Payer: Medicare Other | Admitting: Physician Assistant

## 2014-05-15 ENCOUNTER — Encounter: Payer: Self-pay | Admitting: Physician Assistant

## 2014-05-15 VITALS — BP 98/58 | HR 102 | Ht 72.0 in | Wt 172.0 lb

## 2014-05-15 DIAGNOSIS — I950 Idiopathic hypotension: Secondary | ICD-10-CM

## 2014-05-15 DIAGNOSIS — R000 Tachycardia, unspecified: Secondary | ICD-10-CM

## 2014-05-15 DIAGNOSIS — E785 Hyperlipidemia, unspecified: Secondary | ICD-10-CM

## 2014-05-15 DIAGNOSIS — I2510 Atherosclerotic heart disease of native coronary artery without angina pectoris: Secondary | ICD-10-CM

## 2014-05-15 LAB — CBC WITH DIFFERENTIAL/PLATELET
Basophils Absolute: 0 10*3/uL (ref 0.0–0.1)
Basophils Relative: 0.4 % (ref 0.0–3.0)
Eosinophils Absolute: 0 10*3/uL (ref 0.0–0.7)
Eosinophils Relative: 0.3 % (ref 0.0–5.0)
HCT: 44.4 % (ref 39.0–52.0)
Hemoglobin: 14.5 g/dL (ref 13.0–17.0)
LYMPHS PCT: 12.8 % (ref 12.0–46.0)
Lymphs Abs: 1.4 10*3/uL (ref 0.7–4.0)
MCHC: 32.6 g/dL (ref 30.0–36.0)
MCV: 97.8 fl (ref 78.0–100.0)
MONOS PCT: 4.8 % (ref 3.0–12.0)
Monocytes Absolute: 0.5 10*3/uL (ref 0.1–1.0)
Neutro Abs: 9 10*3/uL — ABNORMAL HIGH (ref 1.4–7.7)
Neutrophils Relative %: 81.7 % — ABNORMAL HIGH (ref 43.0–77.0)
PLATELETS: 195 10*3/uL (ref 150.0–400.0)
RBC: 4.54 Mil/uL (ref 4.22–5.81)
RDW: 15.1 % (ref 11.5–15.5)
WBC: 11 10*3/uL — AB (ref 4.0–10.5)

## 2014-05-15 LAB — BASIC METABOLIC PANEL
BUN: 20 mg/dL (ref 6–23)
CO2: 27 mEq/L (ref 19–32)
Calcium: 9 mg/dL (ref 8.4–10.5)
Chloride: 105 mEq/L (ref 96–112)
Creatinine, Ser: 1 mg/dL (ref 0.4–1.5)
GFR: 77.47 mL/min (ref >60.00)
Glucose, Bld: 118 mg/dL — ABNORMAL HIGH (ref 70–99)
Potassium: 4.3 mEq/L (ref 3.5–5.1)
SODIUM: 139 meq/L (ref 135–145)

## 2014-05-15 LAB — TSH: TSH: 2.02 u[IU]/mL (ref 0.35–4.50)

## 2014-05-15 MED ORDER — ISOSORBIDE MONONITRATE ER 60 MG PO TB24: 60.0000 mg | ORAL_TABLET | Freq: Every day | ORAL | Status: DC

## 2014-05-15 NOTE — Patient Instructions (Addendum)
LAB WORK TODAY; BMET, TSH, CBC W/DIFF  DECREASE IMDUR TO 60 MG DAILY; A NEW RX WAS SENT TO [facility] FOR THE 60 MG TABLET  YOU HAVE A FOLLOW UP WITH DR. Tamala Julian 07/18/14 @ 10 AM

## 2014-05-15 NOTE — Progress Notes (Signed)
Cardiology Office Note   Date:  05/15/2014   ID:  Derrick Andrews, DOB February 08, 1918, MRN [id number]  PCP:  Horton Finer, MD  Cardiologist:  Dr. Daneen Schick     History of Present Illness: Derrick Andrews is a 81 y.o. male with a history of CAD status post PCI with stent to the LAD in 2003, prostate cancer/transitional cell bladder cancer s/p radiation and surgery, HLD, temporal arteritis, and unsteady gait (uses cane). He was seen in this office in October by Melina Copa, PA-C after a trip to the emergency room for chest pain. Cardiac enzymes were negative 1. Admission was recommended but he declined and left AMA.  Continued medical therapy was recommended at that time. He returns for follow-up.  He has not had a recurrence of chest discomfort since he went to the emergency room. He overall feels well. He denies significant dyspnea. He ambulates with a cane. He denies syncope. He denies lightheadedness or dizziness. He denies significant fatigue. He denies orthopnea, PND or edema.   Studies:   - LHC (4/05):  Proximal/mid LAD stent with 50-60% ISR, ostial D1 50%, diffuse RCA 30-40%, EF 65% >> medical therapy  - Nuclear (2009):  No ischemia, EF 67%   Recent Labs: 06/29/2013: Pro B Natriuretic peptide (BNP) 79.0 04/01/2014: BUN 20; Creatinine 0.85; Hemoglobin 14.9; Potassium 4.3; Sodium 137    Recent Radiology: No results found.    Wt Readings from Last 3 Encounters:  05/15/14 172 lb (78.019 kg)  04/06/14 178 lb 12.8 oz (81.103 kg)  01/19/14 176 lb (79.833 kg)     Past Medical History  Diagnosis Date  . Hyperlipidemia   . Coronary artery disease ? 2010    a. hx LAD stent in 2003.  Marland Kitchen Cancer     a. 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  . Urinary frequency     AND SOMETIMES CAN'T TELL WHEN URINE IS COMING - DEPENDS AS NEEDED  . GERD (gastroesophageal reflux disease)   . Arthritis     IN MY BACK  . Hearing problem of  both ears     WEARS BILATERAL HEARING AIDS  . Blind left eye     DIAGNOSED WITH ? TEMPORAL ARTERITIS - AND PUT ON PREDNISONE TO PREVENT BLINDNESS IN RIGHT EYE  . Unsteady gait     USES CANE WHEN AMBULATING  . Temporal arteritis     a. Biopsy proven per records.    Current Outpatient Prescriptions  Medication Sig Dispense Refill  . aspirin 81 MG tablet Take 81 mg by mouth daily.    . calcium carbonate (TUMS - DOSED IN MG ELEMENTAL CALCIUM) 500 MG chewable tablet Chew 1 tablet by mouth daily.    . cyanocobalamin (,VITAMIN B-12,) 1000 MCG/ML injection Inject 1,000 mcg into the muscle every 30 (thirty) days.    . Dextromethorphan-Guaifenesin (ROBITUSSIN PEAK COLD DM PO) Take 5-10 mLs by mouth every 4 (four) hours as needed (cough/cold).    . isosorbide mononitrate (IMDUR) 120 MG 24 hr tablet Take 120 mg by mouth daily.    . nitroGLYCERIN (NITROSTAT) 0.4 MG SL tablet Place 1 tablet (0.4 mg total) under the tongue every 5 (five) minutes as needed for chest pain. 60 tablet 0  . omeprazole (PRILOSEC) 20 MG capsule Take 20 mg by mouth daily.    Marland Kitchen oxybutynin (DITROPAN) 5 MG tablet Take 5 mg by mouth 2 (two) times daily.    . pravastatin (PRAVACHOL) 40 MG  tablet Take 40 mg by mouth at bedtime.     . predniSONE (DELTASONE) 1 MG tablet Take 2 mg by mouth every morning.     . Vitamin D, Ergocalciferol, (DRISDOL) 50000 UNITS CAPS capsule Take 50,000 Units by mouth every 7 (seven) days.     No current facility-administered medications for this visit.     Allergies:   Mercury ammoniated   Social History:  The patient  reports that he has quit smoking. He has never used smokeless tobacco. He reports that he drinks alcohol. He reports that he does not use illicit drugs.   Family History:  The patient's family history includes CAD in his father; Hypertension in his father and mother. There is no history of Heart attack or Stroke.    ROS:  Please see the history of present illness.       All other  systems reviewed and negative.    PHYSICAL EXAM: VS:  BP 98/58 mmHg  Pulse 102  Ht 6' (1.829 m)  Wt 172 lb (78.019 kg)  BMI 23.32 kg/m2  SpO2 94% Well nourished, well developed, in no acute distress HEENT: normal Neck: No JVDAt 90 Cardiac:  normal S1, S2;  RRR; no murmur   Lungs:   clear to auscultation bilaterally, no wheezing, rhonchi or rales Abd: soft, nontender, no hepatomegaly Ext: No edema Skin: warm and dry Neuro:  CNs 2-12 intact, no focal abnormalities noted       ASSESSMENT AND PLAN:  1.  Coronary artery disease: He has not had a recurrence of chest discomfort. He denies any symptoms consistent with angina. Continue aspirin, nitrates, statin. 2.  Tachycardia:  Etiology is not entirely clear. His blood pressures also running lower than normal. He is not symptomatic with this at all. Of note, his beta blocker was discontinued by his primary care physician at some point several months ago. I'm not certain if his tachycardia is related to his underlying malignancy and treatment for this or if it is related to his low blood pressure.    -  Check a BMET, CBC, TSH today.    -  Decrease isosorbide to 60 mg daily.    -  He knows to contact us if he starts to develop symptoms related to his blood pressure. 3.  Hyperlipidemia:  Continue statin.  Disposition:   FU with Dr. Tamala Julian in 2 months.   Signed, Versie Starks, MHS 05/15/2014 10:23 AM    [facility] [place], [place], [place]  [address] Phone: [phone]; Fax: [phone]

## 2014-05-16 ENCOUNTER — Telehealth: Payer: Self-pay | Admitting: Physician Assistant

## 2014-05-16 NOTE — Telephone Encounter (Signed)
See message taken from operator stating ptcb about something that was discovered during yesterday appt with Richardson Dopp, PA. I lmptcb in which I think pt was calling about his lab results which Molinda Bailiff. LPN had lmom earlier today.

## 2014-05-16 NOTE — Telephone Encounter (Signed)
New message  Pt states that he received a call from Richardson Dopp to discuss something that was discovered during the examination.. Please call back to discuss//sr

## 2014-05-17 ENCOUNTER — Telehealth: Payer: Self-pay | Admitting: Physician Assistant

## 2014-05-17 NOTE — Telephone Encounter (Signed)
Follow up ° ° ° ° °Returning a nurses call °

## 2014-05-17 NOTE — Telephone Encounter (Signed)
New Msg  Please contact Donne Hazel physical therapist with [facility]. Contact info [phone], states she will be avail. Rest of  The day

## 2014-05-17 NOTE — Telephone Encounter (Signed)
S/w pt and his concern is about Nicki Reaper W. PA decreasing Imdur to 60 mg QD. Pt states Darnelle from [facility] said his HR was never over 60 when she checked it but that PA decreased Idur he said because HR 100. I explained to pt that the reason for the decrease in the Imdur was not for the 100 HR but was decreased because his BP has been running low. Pt states BP has been running low, I said it probably was since he was on Imdur 120 mg daily. I explained to the pt how the Imdur works to see if this would help him understand a little better but he still seemed somewhat confused by this still.  I asked pt how did Darnelle check his pulse, (ie manually or with a pulse ox) pt states with a pulse ox. I lmom for [facility] to verify what kind of HR readings she was getting.

## 2014-05-19 NOTE — Telephone Encounter (Signed)
I s/w PT for pt  today and she said pt 's HR was elevated on Monday 12/7 @ 99 and Wed 12/9 @ 88, which she states is a little higher for pt. I explained that we decreased Imdur due to hypotension. [facility] states PT was finished 12/9 for pt.

## 2014-05-19 NOTE — Telephone Encounter (Signed)
I s/w PT for pt  today and she said pt 's HR was elevated on Monday 12/7 @ 99 and Wed 12/9 @ 88, which she states is a little higher for pt. I explained that we decreased Imdur due to hypotension. [place] states PT was finished 12/9 for pt.

## 2014-05-22 NOTE — Telephone Encounter (Signed)
Follow Up    Pt is returning phone call from a few days ago. Please call.

## 2014-05-22 NOTE — Telephone Encounter (Signed)
pt notified about lab results and verified appt with Dr. Tamala Julian 07/18/14 10 am. Pt verbalized understanding to Plan of Care.

## 2014-05-24 ENCOUNTER — Telehealth: Payer: Self-pay | Admitting: Interventional Cardiology

## 2014-05-24 NOTE — Telephone Encounter (Signed)
New Msg    Pt daughter Manuela Schwartz returning call about blood work. May be reached at [phone].

## 2014-05-25 NOTE — Telephone Encounter (Signed)
Returned pt daughter call. I was not the person calling her originally. Labs were ordered by [facility] and results reported Ronne Binning. Left message with pt for his daughter to call back if further assistance is needed

## 2014-05-25 NOTE — Telephone Encounter (Signed)
F/U    Pt daughter Manuela Schwartz calling again.   Please attempt to contact 12pm.

## 2014-06-12 ENCOUNTER — Ambulatory Visit: Payer: Medicare Other | Admitting: Interventional Cardiology

## 2014-06-19 ENCOUNTER — Emergency Department (HOSPITAL_COMMUNITY): Payer: Medicare Other

## 2014-06-19 ENCOUNTER — Encounter (HOSPITAL_COMMUNITY): Payer: Self-pay | Admitting: Emergency Medicine

## 2014-06-19 ENCOUNTER — Inpatient Hospital Stay (HOSPITAL_COMMUNITY)
Admission: EM | Admit: 2014-06-19 | Discharge: 2014-06-22 | DRG: 195 | Disposition: A | Discharge status: Skilled Nursing Facility | Payer: Medicare Other | Attending: Internal Medicine | Admitting: Internal Medicine

## 2014-06-19 DIAGNOSIS — J189 Pneumonia, unspecified organism: Principal | ICD-10-CM | POA: Diagnosis present

## 2014-06-19 DIAGNOSIS — Z8551 Personal history of malignant neoplasm of bladder: Secondary | ICD-10-CM

## 2014-06-19 DIAGNOSIS — H5442 Blindness, left eye, normal vision right eye: Secondary | ICD-10-CM | POA: Diagnosis present

## 2014-06-19 DIAGNOSIS — K219 Gastro-esophageal reflux disease without esophagitis: Secondary | ICD-10-CM | POA: Diagnosis present

## 2014-06-19 DIAGNOSIS — Z923 Personal history of irradiation: Secondary | ICD-10-CM

## 2014-06-19 DIAGNOSIS — R350 Frequency of micturition: Secondary | ICD-10-CM | POA: Diagnosis present

## 2014-06-19 DIAGNOSIS — R2681 Unsteadiness on feet: Secondary | ICD-10-CM | POA: Diagnosis present

## 2014-06-19 DIAGNOSIS — H9193 Unspecified hearing loss, bilateral: Secondary | ICD-10-CM | POA: Diagnosis present

## 2014-06-19 DIAGNOSIS — M316 Other giant cell arteritis: Secondary | ICD-10-CM | POA: Diagnosis present

## 2014-06-19 DIAGNOSIS — Z7982 Long term (current) use of aspirin: Secondary | ICD-10-CM

## 2014-06-19 DIAGNOSIS — Z87891 Personal history of nicotine dependence: Secondary | ICD-10-CM

## 2014-06-19 DIAGNOSIS — Z8546 Personal history of malignant neoplasm of prostate: Secondary | ICD-10-CM

## 2014-06-19 DIAGNOSIS — R531 Weakness: Secondary | ICD-10-CM | POA: Diagnosis not present

## 2014-06-19 DIAGNOSIS — Z7952 Long term (current) use of systemic steroids: Secondary | ICD-10-CM

## 2014-06-19 DIAGNOSIS — E785 Hyperlipidemia, unspecified: Secondary | ICD-10-CM | POA: Diagnosis present

## 2014-06-19 DIAGNOSIS — I2510 Atherosclerotic heart disease of native coronary artery without angina pectoris: Secondary | ICD-10-CM | POA: Diagnosis present

## 2014-06-19 LAB — I-STAT CHEM 8, ED
BUN: 17 mg/dL (ref 6–23)
CALCIUM ION: 1.05 mmol/L — AB (ref 1.13–1.30)
Chloride: 102 mEq/L (ref 96–112)
Creatinine, Ser: 0.8 mg/dL (ref 0.50–1.35)
Glucose, Bld: 153 mg/dL — ABNORMAL HIGH (ref 70–99)
HCT: 45 % (ref 39.0–52.0)
HEMOGLOBIN: 15.3 g/dL (ref 13.0–17.0)
POTASSIUM: 4 mmol/L (ref 3.5–5.1)
SODIUM: 133 mmol/L — AB (ref 135–145)
TCO2: 19 mmol/L (ref 0–100)

## 2014-06-19 LAB — CBC WITH DIFFERENTIAL/PLATELET
BASOS PCT: 0 % (ref 0–1)
Basophils Absolute: 0 10*3/uL (ref 0.0–0.1)
EOS ABS: 0.1 10*3/uL (ref 0.0–0.7)
Eosinophils Relative: 1 % (ref 0–5)
HCT: 40.6 % (ref 39.0–52.0)
Hemoglobin: 13.6 g/dL (ref 13.0–17.0)
LYMPHS ABS: 1 10*3/uL (ref 0.7–4.0)
LYMPHS PCT: 6 % — AB (ref 12–46)
MCH: 32.5 pg (ref 26.0–34.0)
MCHC: 33.5 g/dL (ref 30.0–36.0)
MCV: 96.9 fL (ref 78.0–100.0)
MONOS PCT: 8 % (ref 3–12)
Monocytes Absolute: 1.3 10*3/uL — ABNORMAL HIGH (ref 0.1–1.0)
NEUTROS ABS: 13.5 10*3/uL — AB (ref 1.7–7.7)
Neutrophils Relative %: 85 % — ABNORMAL HIGH (ref 43–77)
PLATELETS: 261 10*3/uL (ref 150–400)
RBC: 4.19 MIL/uL — ABNORMAL LOW (ref 4.22–5.81)
RDW: 14.2 % (ref 11.5–15.5)
WBC: 15.8 10*3/uL — ABNORMAL HIGH (ref 4.0–10.5)

## 2014-06-19 LAB — COMPREHENSIVE METABOLIC PANEL
ALK PHOS: 71 U/L (ref 39–117)
ALT: 24 U/L (ref 0–53)
ANION GAP: 6 (ref 5–15)
AST: 32 U/L (ref 0–37)
Albumin: 3.4 g/dL — ABNORMAL LOW (ref 3.5–5.2)
BUN: 18 mg/dL (ref 6–23)
CALCIUM: 8.5 mg/dL (ref 8.4–10.5)
CO2: 23 mmol/L (ref 19–32)
CREATININE: 0.82 mg/dL (ref 0.50–1.35)
Chloride: 103 mEq/L (ref 96–112)
GFR calc Af Amer: 85 mL/min — ABNORMAL LOW (ref >90)
GFR, EST NON AFRICAN AMERICAN: 73 mL/min — AB (ref >90)
GLUCOSE: 156 mg/dL — AB (ref 70–99)
POTASSIUM: 4.1 mmol/L (ref 3.5–5.1)
Sodium: 132 mmol/L — ABNORMAL LOW (ref 135–145)
Total Bilirubin: 1.1 mg/dL (ref 0.3–1.2)
Total Protein: 6.5 g/dL (ref 6.0–8.3)

## 2014-06-19 LAB — TROPONIN I: Troponin I: 0.03 ng/mL (ref <0.031)

## 2014-06-19 LAB — LACTIC ACID, PLASMA: Lactic Acid, Venous: 1.1 mmol/L (ref 0.5–2.2)

## 2014-06-19 LAB — CK: Total CK: 87 U/L (ref 7–232)

## 2014-06-19 MED ORDER — PRAVASTATIN SODIUM 40 MG PO TABS
40.0000 mg | ORAL_TABLET | Freq: Every day | ORAL | Status: DC
  Administered 2014-06-19 – 2014-06-21 (×3): 40 mg via ORAL
  Filled 2014-06-19 (×4): qty 1

## 2014-06-19 MED ORDER — DEXTROSE 5 % IV SOLN
1.0000 g | Freq: Once | INTRAVENOUS | Status: AC
  Administered 2014-06-19: 1 g via INTRAVENOUS
  Filled 2014-06-19: qty 10

## 2014-06-19 MED ORDER — PREDNISONE 1 MG PO TABS
2.0000 mg | ORAL_TABLET | Freq: Every morning | ORAL | Status: DC
  Administered 2014-06-20 – 2014-06-22 (×3): 2 mg via ORAL
  Filled 2014-06-19 (×3): qty 2

## 2014-06-19 MED ORDER — SODIUM CHLORIDE 0.9 % IV SOLN
INTRAVENOUS | Status: DC
  Administered 2014-06-19 – 2014-06-20 (×2): via INTRAVENOUS

## 2014-06-19 MED ORDER — SODIUM CHLORIDE 0.9 % IV BOLUS (SEPSIS)
1000.0000 mL | Freq: Once | INTRAVENOUS | Status: AC
  Administered 2014-06-19: 1000 mL via INTRAVENOUS

## 2014-06-19 MED ORDER — CEFTRIAXONE SODIUM IN DEXTROSE 20 MG/ML IV SOLN
1.0000 g | INTRAVENOUS | Status: DC
  Administered 2014-06-20 – 2014-06-21 (×2): 1 g via INTRAVENOUS
  Filled 2014-06-19 (×3): qty 50

## 2014-06-19 MED ORDER — ENSURE COMPLETE PO LIQD
237.0000 mL | Freq: Two times a day (BID) | ORAL | Status: DC
  Administered 2014-06-20 – 2014-06-22 (×5): 237 mL via ORAL

## 2014-06-19 MED ORDER — DEXTROSE 5 % IV SOLN
500.0000 mg | INTRAVENOUS | Status: DC
  Administered 2014-06-20: 500 mg via INTRAVENOUS
  Filled 2014-06-19 (×2): qty 500

## 2014-06-19 MED ORDER — SODIUM CHLORIDE 0.9 % IV SOLN
INTRAVENOUS | Status: DC
  Administered 2014-06-19: 20:00:00 via INTRAVENOUS

## 2014-06-19 MED ORDER — ASPIRIN EC 81 MG PO TBEC
81.0000 mg | DELAYED_RELEASE_TABLET | Freq: Every day | ORAL | Status: DC
  Administered 2014-06-20 – 2014-06-22 (×3): 81 mg via ORAL
  Filled 2014-06-19 (×3): qty 1

## 2014-06-19 MED ORDER — NITROGLYCERIN 0.4 MG SL SUBL: 0.4000 mg | SUBLINGUAL_TABLET | SUBLINGUAL | Status: DC | PRN

## 2014-06-19 MED ORDER — ISOSORBIDE MONONITRATE ER 60 MG PO TB24
60.0000 mg | ORAL_TABLET | Freq: Every day | ORAL | Status: DC
  Administered 2014-06-20 – 2014-06-22 (×3): 60 mg via ORAL
  Filled 2014-06-19 (×3): qty 1

## 2014-06-19 MED ORDER — HEPARIN SODIUM (PORCINE) 5000 UNIT/ML IJ SOLN
5000.0000 [IU] | Freq: Three times a day (TID) | INTRAMUSCULAR | Status: DC
  Administered 2014-06-19 – 2014-06-22 (×8): 5000 [IU] via SUBCUTANEOUS
  Filled 2014-06-19 (×11): qty 1

## 2014-06-19 MED ORDER — OXYBUTYNIN CHLORIDE 5 MG PO TABS
5.0000 mg | ORAL_TABLET | Freq: Two times a day (BID) | ORAL | Status: DC
  Administered 2014-06-19 – 2014-06-22 (×6): 5 mg via ORAL
  Filled 2014-06-19 (×7): qty 1

## 2014-06-19 MED ORDER — PANTOPRAZOLE SODIUM 40 MG PO TBEC
40.0000 mg | DELAYED_RELEASE_TABLET | Freq: Every day | ORAL | Status: DC
  Administered 2014-06-20 – 2014-06-22 (×3): 40 mg via ORAL
  Filled 2014-06-19 (×3): qty 1

## 2014-06-19 MED ORDER — DEXTROSE 5 % IV SOLN
500.0000 mg | Freq: Once | INTRAVENOUS | Status: AC
  Administered 2014-06-19: 500 mg via INTRAVENOUS
  Filled 2014-06-19: qty 500

## 2014-06-19 NOTE — Progress Notes (Signed)
Utilization Review completed.  Cherokee Boccio RN CM  

## 2014-06-19 NOTE — ED Notes (Signed)
Ambulated patient to restroom with assistance. Pt tolerated it fair.

## 2014-06-19 NOTE — ED Notes (Signed)
Pt c/o malaise onset Thursday night, fatigue, nasal drainage, loss of appetite. Pt states he slipped out of low bed onto floor on Sunday and again this morning when attempting to get out of bed, denies head injury.

## 2014-06-19 NOTE — Progress Notes (Signed)
CSW met with patient at bedside. Daughter was present. Patient is hard of hearing and appeared to be falling asleep. Daughter confirms that the patient is here because of coughing and weakness. Daughter states that patient is able to complete ADL's independently and has an active life which includes driving. Daughter states that the patient usually does well at home. Daughter informed CSW that she lives with patient in [place] at home.   Daughter/Susan Elgie Congo [phone]  Willette Brace [phone] ED CSW 06/19/2014 9:54 PM

## 2014-06-19 NOTE — ED Notes (Signed)
Patient transported to CT 

## 2014-06-19 NOTE — ED Provider Notes (Signed)
CSN: [id number]     Arrival date & time 06/19/14  1338 History   First MD Initiated Contact with Patient 06/19/14 1614     Chief Complaint  Patient presents with  . Cough  . Weakness     (Consider location/radiation/quality/duration/timing/severity/associated sxs/prior Treatment) Patient is a 81 y.o. male presenting with cough and weakness. The history is provided by the patient and a relative.  Cough Associated symptoms: no chest pain, no chills, no fever, no headaches, no rash, no shortness of breath and no sore throat   Weakness Pertinent negatives include no chest pain, no abdominal pain, no headaches and no shortness of breath.  pt with generalized weakness, decreased po intake, decreased activity level for the past 5-6 days. Gradual onset, constant, moderate.  Pt lives w daughter, is normally very active, ambulates on own, conversant, gets out of house/drives.  In past week, pt spending a lot more time in bed, poor po intake, increased cough w yellow sputum.  No cp or sob. On 2 mornings, daughter found pt awake, lying aside of bed, had rolled out or tried to get up in night, and was too weak to get self back in bed. Pt denies pain or injury. No headache. No neck or back pain. No cp. No abd pain. No nvd. No dysuria or gu c/o. +cough, denies sore throat, runny nose or other uri c/o. Compliant w normal meds. Unaware of fever. No chills or sweats.     Past Medical History  Diagnosis Date  . Hyperlipidemia   . Coronary artery disease ? 2010    a. hx LAD stent in 2003.  Marland Kitchen Cancer     a. 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  . Urinary frequency     AND SOMETIMES CAN'T TELL WHEN URINE IS COMING - DEPENDS AS NEEDED  . GERD (gastroesophageal reflux disease)   . Arthritis     IN MY BACK  . Hearing problem of both ears     WEARS BILATERAL HEARING AIDS  . Blind left eye     DIAGNOSED WITH ? TEMPORAL ARTERITIS - AND PUT ON PREDNISONE TO  PREVENT BLINDNESS IN RIGHT EYE  . Unsteady gait     USES CANE WHEN AMBULATING  . Temporal arteritis     a. Biopsy proven per records.   Past Surgical History  Procedure Laterality Date  . Varicose veing stripping in 1950s    . Coronary angioplasty      HEART STENTING  . Tonsillectomy    . Transurethral resection of prostate N/A 01/19/2014    Procedure: TRANSURETHRAL RESECTION OF BLADDER CANCER INCASIVE INTO RIGHT PROSTATE (TURP) AND RESECTION OF SUPERFICIALBLADDER CANCER OF TRIGONE AND BLADDER NECK;  Surgeon: Ailene Rud, MD;  Location: [facility];  Service: Urology;  Laterality: N/A;   Family History  Problem Relation Age of Onset  . CAD Father   . Heart attack Neg Hx   . Stroke Neg Hx   . Hypertension Mother   . Hypertension Father    History  Substance Use Topics  . Smoking status: Former Research scientist (life sciences)  . Smokeless tobacco: Never Used  . Alcohol Use: Yes     Comment: occasional    Review of Systems  Constitutional: Negative for fever and chills.  HENT: Negative for sore throat.   Eyes: Negative for redness.  Respiratory: Positive for cough. Negative for shortness of breath.   Cardiovascular: Negative for chest pain and leg swelling.  Gastrointestinal: Negative  for vomiting, abdominal pain and diarrhea.  Endocrine: Negative for polyuria.  Genitourinary: Negative for dysuria and flank pain.  Musculoskeletal: Negative for back pain and neck pain.  Skin: Negative for rash.  Neurological: Positive for weakness. Negative for headaches.  Hematological: Does not bruise/bleed easily.  Psychiatric/Behavioral: Negative for confusion.      Allergies  Mercury ammoniated  Home Medications   Prior to Admission medications   Medication Sig Start Date End Date Taking? Authorizing Provider  aspirin 81 MG tablet Take 81 mg by mouth daily.    Historical Provider, MD  calcium carbonate (TUMS - DOSED IN MG ELEMENTAL CALCIUM) 500 MG chewable tablet Chew 1 tablet by mouth daily.     Historical Provider, MD  cyanocobalamin (,VITAMIN B-12,) 1000 MCG/ML injection Inject 1,000 mcg into the muscle every 30 (thirty) days.    Historical Provider, MD  Dextromethorphan-Guaifenesin (ROBITUSSIN PEAK COLD DM PO) Take 5-10 mLs by mouth every 4 (four) hours as needed (cough/cold).    Historical Provider, MD  isosorbide mononitrate (IMDUR) 60 MG 24 hr tablet Take 1 tablet (60 mg total) by mouth daily. 05/15/14   Liliane Shi, PA-C  nitroGLYCERIN (NITROSTAT) 0.4 MG SL tablet Place 1 tablet (0.4 mg total) under the tongue every 5 (five) minutes as needed for chest pain. 04/01/14   Dot Lanes, MD  omeprazole (PRILOSEC) 20 MG capsule Take 20 mg by mouth daily.    Historical Provider, MD  oxybutynin (DITROPAN) 5 MG tablet Take 5 mg by mouth 2 (two) times daily.    Historical Provider, MD  pravastatin (PRAVACHOL) 40 MG tablet Take 40 mg by mouth at bedtime.     Historical Provider, MD  predniSONE (DELTASONE) 1 MG tablet Take 2 mg by mouth every morning.     Historical Provider, MD  Vitamin D, Ergocalciferol, (DRISDOL) 50000 UNITS CAPS capsule Take 50,000 Units by mouth every 7 (seven) days.    Historical Provider, MD   BP 133/58 mmHg  Pulse 84  Temp(Src) 97.8 F (36.6 C) (Oral)  Resp 22  SpO2 95% Physical Exam  Constitutional: He is oriented to person, place, and time. He appears well-developed and well-nourished. No distress.  HENT:  Head: Atraumatic.  Mouth/Throat: Oropharynx is clear and moist.  Eyes: Conjunctivae are normal. Pupils are equal, round, and reactive to light. No scleral icterus.  Neck: Neck supple. No tracheal deviation present.  No stiffness or rigidity  Cardiovascular: Normal rate, regular rhythm, normal heart sounds and intact distal pulses.  Exam reveals no gallop and no friction rub.   No murmur heard. Pulmonary/Chest: Effort normal. No accessory muscle usage. No respiratory distress. He has rales.  Upper resp congestion, coughing.   Abdominal: Soft. Bowel  sounds are normal. He exhibits no distension and no mass. There is no tenderness. There is no rebound and no guarding.  Genitourinary:  No cva tenderness.  Musculoskeletal: Normal range of motion. He exhibits no edema or tenderness.  Neurological: He is alert and oriented to person, place, and time. No cranial nerve deficit.  Motor intact bil. sens grossly intact.   Skin: Skin is warm and dry. No rash noted. He is not diaphoretic.  Psychiatric: He has a normal mood and affect.  Nursing note and vitals reviewed.   ED Course  Procedures (including critical care time) Labs Review  Results for orders placed or performed during the hospital encounter of 06/19/14  CBC with Differential  Result Value Ref Range   WBC 15.8 (H) 4.0 - 10.5 K/uL  RBC 4.19 (L) 4.22 - 5.81 MIL/uL   Hemoglobin 13.6 13.0 - 17.0 g/dL   HCT 40.6 39.0 - 52.0 %   MCV 96.9 78.0 - 100.0 fL   MCH 32.5 26.0 - 34.0 pg   MCHC 33.5 30.0 - 36.0 g/dL   RDW 14.2 11.5 - 15.5 %   Platelets 261 150 - 400 K/uL   Neutrophils Relative % 85 (H) 43 - 77 %   Neutro Abs 13.5 (H) 1.7 - 7.7 K/uL   Lymphocytes Relative 6 (L) 12 - 46 %   Lymphs Abs 1.0 0.7 - 4.0 K/uL   Monocytes Relative 8 3 - 12 %   Monocytes Absolute 1.3 (H) 0.1 - 1.0 K/uL   Eosinophils Relative 1 0 - 5 %   Eosinophils Absolute 0.1 0.0 - 0.7 K/uL   Basophils Relative 0 0 - 1 %   Basophils Absolute 0.0 0.0 - 0.1 K/uL  Comprehensive metabolic panel  Result Value Ref Range   Sodium 132 (L) 135 - 145 mmol/L   Potassium 4.1 3.5 - 5.1 mmol/L   Chloride 103 96 - 112 mEq/L   CO2 23 19 - 32 mmol/L   Glucose, Bld 156 (H) 70 - 99 mg/dL   BUN 18 6 - 23 mg/dL   Creatinine, Ser 0.82 0.50 - 1.35 mg/dL   Calcium 8.5 8.4 - 10.5 mg/dL   Total Protein 6.5 6.0 - 8.3 g/dL   Albumin 3.4 (L) 3.5 - 5.2 g/dL   AST 32 0 - 37 U/L   ALT 24 0 - 53 U/L   Alkaline Phosphatase 71 39 - 117 U/L   Total Bilirubin 1.1 0.3 - 1.2 mg/dL   GFR calc non Af Amer 73 (L) >90 mL/min   GFR calc Af  Amer 85 (L) >90 mL/min   Anion gap 6 5 - 15  Lactic acid, plasma  Result Value Ref Range   Lactic Acid, Venous 1.1 0.5 - 2.2 mmol/L  Troponin I  Result Value Ref Range   Troponin I 0.03 <0.031 ng/mL  CK  Result Value Ref Range   Total CK 87 7 - 232 U/L  I-Stat Chem 8, ED  Result Value Ref Range   Sodium 133 (L) 135 - 145 mmol/L   Potassium 4.0 3.5 - 5.1 mmol/L   Chloride 102 96 - 112 mEq/L   BUN 17 6 - 23 mg/dL   Creatinine, Ser 0.80 0.50 - 1.35 mg/dL   Glucose, Bld 153 (H) 70 - 99 mg/dL   Calcium, Ion 1.05 (L) 1.13 - 1.30 mmol/L   TCO2 19 0 - 100 mmol/L   Hemoglobin 15.3 13.0 - 17.0 g/dL   HCT 45.0 39.0 - 52.0 %   Dg Chest 2 View  06/19/2014   CLINICAL DATA:  Malaise.  Weakness.  Anorexia.  EXAM: CHEST  2 VIEW  COMPARISON:  04/01/2014 and 01/06/2014  FINDINGS: Diffuse chronic interstitial lung disease is again noted. Increased patchy airspace opacity is seen in the right upper lobe, suspicious for superimposed pneumonia.  Low lung volumes again noted. No evidence of pleural effusion. Heart size is within normal limits.  IMPRESSION: Diffuse chronic interstitial lung disease, with increased patchy airspace opacity in right upper lobe. Superimposed pneumonia cannot be excluded. Continued chest radiographic followup recommended.   Electronically Signed   By: Earle Gell M.D.   On: 06/19/2014 17:28       EKG Interpretation   Date/Time:  Monday June 19 2014 13:54:05 EST Ventricular Rate:  86 PR  Interval:  185 QRS Duration: 81 QT Interval:  376 QTC Calculation: 450 R Axis:   2 Text Interpretation:  Sinus rhythm No significant change since last  tracing Confirmed by Ashok Cordia  MD, Lennette Bihari ([id number]) on 06/19/2014 5:49:09 PM      MDM   Iv ns bolus. Labs.  Reviewed nursing notes and prior charts for additional history.   Pt with resp congestion, elevated wbc, weakness, poor po intake, and pna on cxr.  No recent inpatient admission and pt from home.  Will rx for suspected cap, and  admit.  Rocephin and zithromax iv.   hospitalist paged for admission.     Mirna Mires, MD 06/19/14 1750

## 2014-06-19 NOTE — ED Notes (Signed)
Patient reports feeling weak and tired x 4 days

## 2014-06-19 NOTE — H&P (Signed)
[facility] History and Physical  Derrick Andrews QIO:[id number] DOB: [birth year] DOA: 06/19/2014  Referring  physician: Dr. Ashok Cordia PCP: Horton Finer, MD   Chief Complaint: weakness and cough  HPI: Derrick Andrews is a 81 y.o. male  With history of CAD, hyperlipidemia, GERD. States that during Christmas he was around sick contacts. Subsequently he developed rhinorrhea and upper respiratory symptoms. Within the last 3 days he complains of worsening weakness. Today he is not eaten all that well. The problem has been persistent and progressively getting worse as such patient presented to the ED for further evaluation. While in the ED patient had a chest x-ray which reported increased patchy airspace opacity in the right upper lobe. Patient was also found to have leukocytosis. He denies any shortness of breath with activity but does admit to having some productive cough.  We were consulted for further medical evaluation recommendations given persistent weakness and pneumonia. Patient has not recently been hospitalized and lives at home.   Review of Systems:  Constitutional:  No weight loss, night sweats, Fevers, chills, fatigue.  HEENT:  No headaches, Difficulty swallowing,Tooth/dental problems,Sore throat,  No sneezing, itching, ear ache, nasal congestion, post nasal drip,  Cardio-vascular:  No chest pain, Orthopnea, PND, swelling in lower extremities, anasarca, dizziness, palpitations  GI:  No heartburn, indigestion, abdominal pain, nausea, vomiting, diarrhea, change in bowel habits, loss of appetite  Resp:  No shortness of breath with exertion or at rest. No excess mucus,+ productive cough, No non-productive cough, No coughing up of blood.No change in color of mucus.No wheezing.No chest wall deformity  Skin:  no rash or lesions.  GU:  no dysuria, change in color of urine, no urgency or frequency. No flank pain.  Musculoskeletal:  No joint pain or swelling. No decreased range  of motion. No back pain.  Psych:  No change in mood or affect. No depression or anxiety. No memory loss.   Past Medical History  Diagnosis Date  . Hyperlipidemia   . Coronary artery disease ? 2010    a. hx LAD stent in 2003.  Marland Kitchen Cancer     a. 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  . Urinary frequency     AND SOMETIMES CAN'T TELL WHEN URINE IS COMING - DEPENDS AS NEEDED  . GERD (gastroesophageal reflux disease)   . Arthritis     IN MY BACK  . Hearing problem of both ears     WEARS BILATERAL HEARING AIDS  . Blind left eye     DIAGNOSED WITH ? TEMPORAL ARTERITIS - AND PUT ON PREDNISONE TO PREVENT BLINDNESS IN RIGHT EYE  . Unsteady gait     USES CANE WHEN AMBULATING  . Temporal arteritis     a. Biopsy proven per records.   Past Surgical History  Procedure Laterality Date  . Varicose veing stripping in 1950s    . Coronary angioplasty      HEART STENTING  . Tonsillectomy    . Transurethral resection of prostate N/A 01/19/2014    Procedure: TRANSURETHRAL RESECTION OF BLADDER CANCER INCASIVE INTO RIGHT PROSTATE (TURP) AND RESECTION OF SUPERFICIALBLADDER CANCER OF TRIGONE AND BLADDER NECK;  Surgeon: Ailene Rud, MD;  Location: [facility];  Service: Urology;  Laterality: N/A;   Social History:  reports that he has quit smoking. He has never used smokeless tobacco. He reports that he drinks alcohol. He reports that he does not use illicit drugs.  Allergies  Allergen Reactions  .  Mercury Ammoniated [Ammoniated Mercury] Other (See Comments)    Blisters    Family History  Problem Relation Age of Onset  . CAD Father   . Heart attack Neg Hx   . Stroke Neg Hx   . Hypertension Mother   . Hypertension Father      Prior to Admission medications   Medication Sig Start Date End Date Taking? Authorizing Provider  acetaminophen (TYLENOL) 325 MG tablet Take 650 mg by mouth every 6 (six) hours as needed for moderate pain (arthritis  pain).   Yes Historical Provider, MD  aspirin 81 MG tablet Take 81 mg by mouth daily.   Yes Historical Provider, MD  calcium carbonate (TUMS - DOSED IN MG ELEMENTAL CALCIUM) 500 MG chewable tablet Chew 1 tablet by mouth daily.   Yes Historical Provider, MD  cyanocobalamin (,VITAMIN B-12,) 1000 MCG/ML injection Inject 1,000 mcg into the muscle every 30 (thirty) days.   Yes Historical Provider, MD  Dextromethorphan-Guaifenesin (ROBITUSSIN PEAK COLD DM PO) Take 5-10 mLs by mouth every 4 (four) hours as needed (cough/cold).   Yes Historical Provider, MD  omeprazole (PRILOSEC) 20 MG capsule Take 20 mg by mouth daily.   Yes Historical Provider, MD  oxybutynin (DITROPAN) 5 MG tablet Take 5 mg by mouth 2 (two) times daily.   Yes Historical Provider, MD  pravastatin (PRAVACHOL) 40 MG tablet Take 40 mg by mouth at bedtime.    Yes Historical Provider, MD  predniSONE (DELTASONE) 1 MG tablet Take 2 mg by mouth every morning.    Yes Historical Provider, MD  isosorbide mononitrate (IMDUR) 60 MG 24 hr tablet Take 1 tablet (60 mg total) by mouth daily. 05/15/14   Liliane Shi, PA-C  nitroGLYCERIN (NITROSTAT) 0.4 MG SL tablet Place 1 tablet (0.4 mg total) under the tongue every 5 (five) minutes as needed for chest pain. 04/01/14   Dot Lanes, MD   Physical Exam: Filed Vitals:   06/19/14 1347 06/19/14 1857 06/19/14 1912  BP: 133/58  128/45  Pulse: 84 80 87  Temp: 97.8 F (36.6 C)  97.9 F (36.6 C)  TempSrc: Oral  Oral  Resp: 22 16 18   SpO2: 95% 96% 97%    Wt Readings from Last 3 Encounters:  05/15/14 78.019 kg (172 lb)  04/06/14 81.103 kg (178 lb 12.8 oz)  01/19/14 79.833 kg (176 lb)    General:  Appears calm and comfortable Eyes: PERRL, normal lids, irises & conjunctiva ENT: grossly normal hearing, lips & tongue Neck: no LAD, masses or thyromegaly Cardiovascular: RRR, no m/r/g. No LE edema. Telemetry: SR, no arrhythmias  Respiratory: CTA bilaterally, no w/r/r. Normal respiratory  effort. Abdomen: soft, nt, nd Skin: no rash or induration seen on limited exam Musculoskeletal: grossly normal tone BUE/BLE Psychiatric: grossly normal mood and affect, speech fluent and appropriate Neurologic: Answers questions appropriately, moves extremities equally, no facial asymmetry           Labs on Admission:  Basic Metabolic Panel:  Recent Labs Lab 06/19/14 1435 06/19/14 1505  NA 132* 133*  K 4.1 4.0  CL 103 102  CO2 23  --   GLUCOSE 156* 153*  BUN 18 17  CREATININE 0.82 0.80  CALCIUM 8.5  --    Liver Function Tests:  Recent Labs Lab 06/19/14 1435  AST 32  ALT 24  ALKPHOS 71  BILITOT 1.1  PROT 6.5  ALBUMIN 3.4*   No results for input(s): LIPASE, AMYLASE in the last 168 hours. No results for input(s): AMMONIA  in the last 168 hours. CBC:  Recent Labs Lab 06/19/14 1435 06/19/14 1505  WBC 15.8*  --   NEUTROABS 13.5*  --   HGB 13.6 15.3  HCT 40.6 45.0  MCV 96.9  --   PLT 261  --    Cardiac Enzymes:  Recent Labs Lab 06/19/14 1435  CKTOTAL 87  TROPONINI 0.03    BNP (last 3 results)  Recent Labs  06/29/13 1552  PROBNP 79.0   CBG: No results for input(s): GLUCAP in the last 168 hours.  Radiological Exams on Admission: Dg Chest 2 View  06/19/2014   CLINICAL DATA:  Malaise.  Weakness.  Anorexia.  EXAM: CHEST  2 VIEW  COMPARISON:  04/01/2014 and 01/06/2014  FINDINGS: Diffuse chronic interstitial lung disease is again noted. Increased patchy airspace opacity is seen in the right upper lobe, suspicious for superimposed pneumonia.  Low lung volumes again noted. No evidence of pleural effusion. Heart size is within normal limits.  IMPRESSION: Diffuse chronic interstitial lung disease, with increased patchy airspace opacity in right upper lobe. Superimposed pneumonia cannot be excluded. Continued chest radiographic followup recommended.   Electronically Signed   By: Earle Gell M.D.   On: 06/19/2014 17:28    EKG: Independently reviewed. Sinus  Rhythm some artifact  Assessment/Plan Principal Problem:   CAP (community acquired pneumonia) - have placed routine pneumonia order set. Please review for details - continue IV antibiotic therapy: Azithromycin and Rocephin - Sputum cx order placed  Leukocytosis - New problem most likely related to principle problem although patient is on low dose prednisone therapy - Will reassess next am  Weakness - New problem most likely related to CAP - up with assistance and PT consult  Active Problems:   GERD (gastroesophageal reflux disease) - stable continue PPI  HPL - continue statin  CAD - no chest pain reported continue nitroglycerin prn chest pain, asa, and statin - troponin negative  Temporal arteritis - Pt on prednisone low dose at home, will not stress dose at this juncture   Code Status: full code DVT Prophylaxis: Heparin Family Communication: discussed with sister and daughter at bedside Disposition Plan: Med surg  Time spent: > 45 minutes  Velvet Bathe [facility] Pager [phone]

## 2014-06-19 NOTE — Progress Notes (Signed)
Paged Dr. Wendee Beavers regarding admission status.

## 2014-06-20 DIAGNOSIS — H5442 Blindness, left eye, normal vision right eye: Secondary | ICD-10-CM | POA: Diagnosis present

## 2014-06-20 DIAGNOSIS — E785 Hyperlipidemia, unspecified: Secondary | ICD-10-CM | POA: Diagnosis present

## 2014-06-20 DIAGNOSIS — M316 Other giant cell arteritis: Secondary | ICD-10-CM

## 2014-06-20 DIAGNOSIS — R531 Weakness: Secondary | ICD-10-CM | POA: Diagnosis present

## 2014-06-20 DIAGNOSIS — I2510 Atherosclerotic heart disease of native coronary artery without angina pectoris: Secondary | ICD-10-CM | POA: Diagnosis present

## 2014-06-20 DIAGNOSIS — J189 Pneumonia, unspecified organism: Secondary | ICD-10-CM | POA: Diagnosis present

## 2014-06-20 DIAGNOSIS — Z8546 Personal history of malignant neoplasm of prostate: Secondary | ICD-10-CM | POA: Diagnosis not present

## 2014-06-20 DIAGNOSIS — Z8551 Personal history of malignant neoplasm of bladder: Secondary | ICD-10-CM | POA: Diagnosis not present

## 2014-06-20 DIAGNOSIS — Z7982 Long term (current) use of aspirin: Secondary | ICD-10-CM | POA: Diagnosis not present

## 2014-06-20 DIAGNOSIS — H9193 Unspecified hearing loss, bilateral: Secondary | ICD-10-CM | POA: Diagnosis present

## 2014-06-20 DIAGNOSIS — Z7952 Long term (current) use of systemic steroids: Secondary | ICD-10-CM | POA: Diagnosis not present

## 2014-06-20 DIAGNOSIS — R2681 Unsteadiness on feet: Secondary | ICD-10-CM | POA: Diagnosis present

## 2014-06-20 DIAGNOSIS — R350 Frequency of micturition: Secondary | ICD-10-CM | POA: Diagnosis present

## 2014-06-20 DIAGNOSIS — Z87891 Personal history of nicotine dependence: Secondary | ICD-10-CM | POA: Diagnosis not present

## 2014-06-20 DIAGNOSIS — K219 Gastro-esophageal reflux disease without esophagitis: Secondary | ICD-10-CM | POA: Diagnosis present

## 2014-06-20 DIAGNOSIS — Z923 Personal history of irradiation: Secondary | ICD-10-CM | POA: Diagnosis not present

## 2014-06-20 LAB — URINALYSIS, ROUTINE W REFLEX MICROSCOPIC
Glucose, UA: 100 mg/dL — AB
Ketones, ur: NEGATIVE mg/dL
Nitrite: NEGATIVE
PROTEIN: NEGATIVE mg/dL
Specific Gravity, Urine: 1.025 (ref 1.005–1.030)
Urobilinogen, UA: 1 mg/dL (ref 0.0–1.0)
pH: 5.5 (ref 5.0–8.0)

## 2014-06-20 LAB — URINE MICROSCOPIC-ADD ON

## 2014-06-20 LAB — BASIC METABOLIC PANEL
Anion gap: 7 (ref 5–15)
BUN: 14 mg/dL (ref 6–23)
CO2: 25 mmol/L (ref 19–32)
CREATININE: 0.72 mg/dL (ref 0.50–1.35)
Calcium: 7.9 mg/dL — ABNORMAL LOW (ref 8.4–10.5)
Chloride: 103 mEq/L (ref 96–112)
GFR calc non Af Amer: 77 mL/min — ABNORMAL LOW (ref >90)
GFR, EST AFRICAN AMERICAN: 90 mL/min — AB (ref >90)
GLUCOSE: 121 mg/dL — AB (ref 70–99)
POTASSIUM: 3.6 mmol/L (ref 3.5–5.1)
Sodium: 135 mmol/L (ref 135–145)

## 2014-06-20 LAB — CBC
HCT: 36.4 % — ABNORMAL LOW (ref 39.0–52.0)
Hemoglobin: 12.2 g/dL — ABNORMAL LOW (ref 13.0–17.0)
MCH: 32.4 pg (ref 26.0–34.0)
MCHC: 33.5 g/dL (ref 30.0–36.0)
MCV: 96.8 fL (ref 78.0–100.0)
Platelets: 214 10*3/uL (ref 150–400)
RBC: 3.76 MIL/uL — ABNORMAL LOW (ref 4.22–5.81)
RDW: 14.2 % (ref 11.5–15.5)
WBC: 9.5 10*3/uL (ref 4.0–10.5)

## 2014-06-20 LAB — HIV ANTIBODY (ROUTINE TESTING W REFLEX)
HIV 1/O/2 Abs-Index Value: <1 (ref <1.00)
HIV-1/HIV-2 Ab: NONREACTIVE

## 2014-06-20 LAB — LEGIONELLA ANTIGEN, URINE

## 2014-06-20 LAB — STREP PNEUMONIAE URINARY ANTIGEN: Strep Pneumo Urinary Antigen: NEGATIVE

## 2014-06-20 MED ORDER — GUAIFENESIN ER 600 MG PO TB12
1200.0000 mg | ORAL_TABLET | Freq: Two times a day (BID) | ORAL | Status: DC
  Administered 2014-06-20 – 2014-06-22 (×5): 1200 mg via ORAL
  Filled 2014-06-20 (×6): qty 2

## 2014-06-20 MED ORDER — HALOPERIDOL LACTATE 5 MG/ML IJ SOLN
2.0000 mg | Freq: Three times a day (TID) | INTRAMUSCULAR | Status: DC | PRN
  Administered 2014-06-20: 2 mg via INTRAVENOUS
  Filled 2014-06-20: qty 1

## 2014-06-20 MED ORDER — GUAIFENESIN-DM 100-10 MG/5ML PO SYRP
5.0000 mL | ORAL_SOLUTION | ORAL | Status: DC | PRN
  Administered 2014-06-20: 5 mL via ORAL
  Filled 2014-06-20: qty 10

## 2014-06-20 MED ORDER — ACETAMINOPHEN 325 MG PO TABS
650.0000 mg | ORAL_TABLET | Freq: Four times a day (QID) | ORAL | Status: DC | PRN
  Administered 2014-06-20 – 2014-06-22 (×2): 650 mg via ORAL
  Filled 2014-06-20 (×2): qty 2

## 2014-06-20 NOTE — Progress Notes (Signed)
INITIAL NUTRITION ASSESSMENT  DOCUMENTATION CODES Per approved criteria  -Not Applicable   INTERVENTION: -Recommend 2PM and 8PM nourishments -Recommend Ensure Complete po BID, each supplement provides 350 kcal and 13 grams of protein -Encouraged use of high kcal/protein snacks and foods to incorporate into meals -RD to continue to monitor  NUTRITION DIAGNOSIS: Unintentional wt loss related to chronic disease/advanced age as evidenced by 13 lb wt loss in one year.   Goal: Pt to meet >/= 90% of their estimated nutrition needs    Monitor:  Total protein/energy intake, labs, weights  Reason for Assessment: MST  81 y.o. male  Admitting Dx: CAP (community acquired pneumonia)  ASSESSMENT: Derrick Andrews is a 81 y.o. male With history of CAD, hyperlipidemia, GERD. States that during Christmas he was around sick contacts. Subsequently he developed rhinorrhea and upper respiratory symptoms. Within the last 3 days he complains of worsening weakness  -Pt's daughter reported pt with decreased appetite for past 3 days; reported prior to that pt was eating very well. Daughter assists pt with meal preparation at home. -Diet recall indicate pt consuming 3 meals daily- breakfast of cereal w/fruit, lunch usually a sandwich, and dinner consisting of meat/vegetable/starch -Snacks on dessert foods, such as ice cream, milkshakes made of Ensure, or chocolate chips -Endorsed weight loss of 13 lbs in one year (6.9% body weight loss, non-severe for time frame) -Pt appetite improving, ate 75% of breakfast -Encouraged continued use of high protein/kcal foods for assistance in muscle and weight maintenance   Height: Ht Readings from Last 1 Encounters:  06/19/14 6\' 3"  (1.905 m)    Weight: Wt Readings from Last 1 Encounters:  06/19/14 167 lb 11.2 oz (76.068 kg)    Ideal Body Weight: 196 lb  % Ideal Body Weight: 85%  Wt Readings from Last 10 Encounters:  06/19/14 167 lb 11.2 oz (76.068 kg)   05/15/14 172 lb (78.019 kg)  04/06/14 178 lb 12.8 oz (81.103 kg)  01/19/14 176 lb (79.833 kg)  01/06/14 176 lb (79.833 kg)  12/29/13 176 lb (79.833 kg)  06/29/13 180 lb (81.647 kg)  03/15/12 187 lb (84.823 kg)  02/03/12 186 lb 9.6 oz (84.641 kg)  01/02/12 185 lb 9.6 oz (84.188 kg)    Usual Body Weight: 180 lb  % Usual Body Weight: 93%  BMI:  Body mass index is 20.96 kg/(m^2).  Estimated Nutritional Needs: Kcal: 1900-2100 Protein: 75-90 gram  Fluid: >/= 1900 ml daily  Skin: WDL  Diet Order: Diet regular  EDUCATION NEEDS: -No education needs identified at this time   Intake/Output Summary (Last 24 hours) at 06/20/14 1156 Last data filed at 06/20/14 0841  Gross per 24 hour  Intake 1560.83 ml  Output    700 ml  Net 860.83 ml    Last BM: 1/10   Labs:   Recent Labs Lab 06/19/14 1435 06/19/14 1505 06/20/14 0500  NA 132* 133* 135  K 4.1 4.0 3.6  CL 103 102 103  CO2 23  --  25  BUN 18 17 14   CREATININE 0.82 0.80 0.72  CALCIUM 8.5  --  7.9*  GLUCOSE 156* 153* 121*    CBG (last 3)  No results for input(s): GLUCAP in the last 72 hours.  Scheduled Meds: . aspirin EC  81 mg Oral Daily  . azithromycin  500 mg Intravenous Q24H  . cefTRIAXone (ROCEPHIN)  IV  1 g Intravenous Q24H  . feeding supplement (ENSURE COMPLETE)  237 mL Oral BID BM  . heparin  5,000 Units Subcutaneous 3 times per day  . isosorbide mononitrate  60 mg Oral Daily  . oxybutynin  5 mg Oral BID  . pantoprazole  40 mg Oral Daily  . pravastatin  40 mg Oral QHS  . predniSONE  2 mg Oral q morning - 10a    Continuous Infusions: . sodium chloride 50 mL/hr at 06/20/14 1005    Past Medical History  Diagnosis Date  . Hyperlipidemia   . Coronary artery disease ? 2010    a. hx LAD stent in 2003.  Marland Kitchen Cancer     a. 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  . Urinary frequency     AND SOMETIMES CAN'T TELL WHEN URINE IS COMING - DEPENDS AS NEEDED   . GERD (gastroesophageal reflux disease)   . Arthritis     IN MY BACK  . Hearing problem of both ears     WEARS BILATERAL HEARING AIDS  . Blind left eye     DIAGNOSED WITH ? TEMPORAL ARTERITIS - AND PUT ON PREDNISONE TO PREVENT BLINDNESS IN RIGHT EYE  . Unsteady gait     USES CANE WHEN AMBULATING  . Temporal arteritis     a. Biopsy proven per records.    Past Surgical History  Procedure Laterality Date  . Varicose veing stripping in 1950s    . Coronary angioplasty      HEART STENTING  . Tonsillectomy    . Transurethral resection of prostate N/A 01/19/2014    Procedure: TRANSURETHRAL RESECTION OF BLADDER CANCER INCASIVE INTO RIGHT PROSTATE (TURP) AND RESECTION OF SUPERFICIALBLADDER CANCER OF TRIGONE AND BLADDER NECK;  Surgeon: Ailene Rud, MD;  Location: [facility];  Service: Urology;  Laterality: N/A;    Atlee Abide MS RD LDN Clinical Dietitian [phone]

## 2014-06-20 NOTE — Progress Notes (Signed)
[facility] PROGRESS NOTE   Derrick Andrews ATF:[id number] DOB: 29-Jun-1917 DOA: 06/19/2014 PCP: Horton Finer, MD  HPI/Subjective: Seen with daughter at bedside, patient is sleepy, hard of hearing. Does not complain about significant shortness of breath but complains about being awfully weak.  Assessment/Plan: Principal Problem:   CAP (community acquired pneumonia) Active Problems:   Hyperlipidemia   Temporal arteritis   Coronary artery disease   GERD (gastroesophageal reflux disease)    CAP Presented with shortness of breath, cough and weakness. CXR showed patchy opacities in the RUL consistent with pneumonia on top of diffuse chronic ILD. Patient is started on Rocephin and azithromycin. Supportive management with bronchodilators, mucolytics, antitussives and oxygen as needed.  Generalized weakness Likely secondary to his CAP, treat pneumonia, get nutritionist to see the patient. PT/OT to evaluate and treat. Discussed with his daughter at bedside who takes care of him at home, she will think about SNF if recommended.  Temporal arteritis Patient is on low-dose of prednisone at home, continue home dose of the steroid. Hemodynamically stable, don't think there is need for stress dose of steroid.  GERD Stable continue home medications.  Code Status: Full code Family Communication: Plan discussed with the patient. Disposition Plan: Remains inpatient   Consultants:  None  Procedures:  None  Antibiotics:  Azithromycin and Rocephin   Objective: Filed Vitals:   06/20/14 0755  BP:   Pulse: 82  Temp:   Resp:     Intake/Output Summary (Last 24 hours) at 06/20/14 1201 Last data filed at 06/20/14 0841  Gross per 24 hour  Intake 1560.83 ml  Output    700 ml  Net 860.83 ml   Filed Weights   06/19/14 2233  Weight: 76.068 kg (167 lb 11.2 oz)    Exam: General: Alert and awake, oriented x3, not in any acute distress. HEENT: anicteric sclera,  pupils reactive to light and accommodation, EOMI CVS: S1-S2 clear, no murmur rubs or gallops Chest: clear to auscultation bilaterally, no wheezing, rales or rhonchi Abdomen: soft nontender, nondistended, normal bowel sounds, no organomegaly Extremities: no cyanosis, clubbing or edema noted bilaterally Neuro: Cranial nerves II-XII intact, no focal neurological deficits  Data Reviewed: Basic Metabolic Panel:  Recent Labs Lab 06/19/14 1435 06/19/14 1505 06/20/14 0500  NA 132* 133* 135  K 4.1 4.0 3.6  CL 103 102 103  CO2 23  --  25  GLUCOSE 156* 153* 121*  BUN 18 17 14   CREATININE 0.82 0.80 0.72  CALCIUM 8.5  --  7.9*   Liver Function Tests:  Recent Labs Lab 06/19/14 1435  AST 32  ALT 24  ALKPHOS 71  BILITOT 1.1  PROT 6.5  ALBUMIN 3.4*   No results for input(s): LIPASE, AMYLASE in the last 168 hours. No results for input(s): AMMONIA in the last 168 hours. CBC:  Recent Labs Lab 06/19/14 1435 06/19/14 1505 06/20/14 0500  WBC 15.8*  --  9.5  NEUTROABS 13.5*  --   --   HGB 13.6 15.3 12.2*  HCT 40.6 45.0 36.4*  MCV 96.9  --  96.8  PLT 261  --  214   Cardiac Enzymes:  Recent Labs Lab 06/19/14 1435  CKTOTAL 87  TROPONINI 0.03   BNP (last 3 results)  Recent Labs  06/29/13 1552  PROBNP 79.0   CBG: No results for input(s): GLUCAP in the last 168 hours.  Micro No results found for this or any previous visit (from the past 240 hour(s)).   Studies: Dg Chest 2  View  06/19/2014   CLINICAL DATA:  Malaise.  Weakness.  Anorexia.  EXAM: CHEST  2 VIEW  COMPARISON:  04/01/2014 and 01/06/2014  FINDINGS: Diffuse chronic interstitial lung disease is again noted. Increased patchy airspace opacity is seen in the right upper lobe, suspicious for superimposed pneumonia.  Low lung volumes again noted. No evidence of pleural effusion. Heart size is within normal limits.  IMPRESSION: Diffuse chronic interstitial lung disease, with increased patchy airspace opacity in right  upper lobe. Superimposed pneumonia cannot be excluded. Continued chest radiographic followup recommended.   Electronically Signed   By: Earle Gell M.D.   On: 06/19/2014 17:28    Scheduled Meds: . aspirin EC  81 mg Oral Daily  . azithromycin  500 mg Intravenous Q24H  . cefTRIAXone (ROCEPHIN)  IV  1 g Intravenous Q24H  . feeding supplement (ENSURE COMPLETE)  237 mL Oral BID BM  . heparin  5,000 Units Subcutaneous 3 times per day  . isosorbide mononitrate  60 mg Oral Daily  . oxybutynin  5 mg Oral BID  . pantoprazole  40 mg Oral Daily  . pravastatin  40 mg Oral QHS  . predniSONE  2 mg Oral q morning - 10a   Continuous Infusions: . sodium chloride 50 mL/hr at 06/20/14 1005       Time spent: 35 minutes    [facility] A  [facility] Pager [phone] If 7PM-7AM, please contact night-coverage at [email or web address], password [email or web address] 06/20/2014, 12:01 PM  LOS: 1 day

## 2014-06-20 NOTE — Progress Notes (Signed)
Pt very agitated, confused, refusing to cooperate and repeatedly leaving the bed unsupervised. MD made aware, new orders given. Haldol administered. Family updated. Will continue to monitor.

## 2014-06-20 NOTE — Plan of Care (Signed)
Problem: Acute Rehab PT Goals(only PT should resolve) Goal: Pt Will Go Supine/Side To Sit Without bedrails Goal: Pt Will Transfer Bed To Chair/Chair To Bed And LRAD

## 2014-06-20 NOTE — Evaluation (Addendum)
Physical Therapy Evaluation Patient Details Name: Derrick Andrews MRN: [id number] DOB: 07/30/1917 Today's Date: 06/20/2014   History of Present Illness  81 yo male with onset of cough and leukocytosis was admitted ffor hypoxia, and had PMHx:  CAD, HLD, GERD, prostate CA with incontinence, arteritis with L eye visual losss, HOH B   Clinical Impression  Pt was seen with daughter mostly present to ask questions of her and get PLOF.  He is much weaker than his PLOF per both pt and daughter, and may need to be admitted to inpt care for rehab.  The result of O2 sats from activity was that pre-eval was 91% then up to 94% sitting in chair.  Good response to activity, incontinent and has limited tolerance for standing due to incontinence.  Could not void when he reported he needed to but may have already gone as much as he could.    Follow Up Recommendations SNF;Supervision/Assistance - 24 hour    Equipment Recommendations  None recommended by PT    Recommendations for Other Services       Precautions / Restrictions Precautions Precautions: Fall;Other (comment) (L eye vision loss, HOH) Precaution Comments: fragile skin Restrictions Weight Bearing Restrictions: No      Mobility  Bed Mobility Overal bed mobility: Needs Assistance Bed Mobility: Rolling;Supine to Sit Rolling: Min assist   Supine to sit: Mod assist     General bed mobility comments: pt is confused and struggles to sequence well  Transfers Overall transfer level: Needs assistance Equipment used: 2 person hand held assist Transfers: Sit to/from [facility] Sit to Stand: Mod assist;+2 physical assistance;+2 safety/equipment Stand pivot transfers: Mod assist;+2 physical assistance;+2 safety/equipment       General transfer comment: Pt had mult attempts as he felt like he would need to void upon standing.  Could not initiate voiding but did have to change his gown due to being wet.  Ambulation/Gait              General Gait Details: sidestepping to chair only  Stairs            Wheelchair Mobility    Modified Rankin (Stroke Patients Only)       Balance Overall balance assessment: Needs assistance Sitting-balance support: Feet supported;Bilateral upper extremity supported Sitting balance-Leahy Scale: Poor   Postural control: Posterior lean Standing balance support: Bilateral upper extremity supported Standing balance-Leahy Scale: Poor                               Pertinent Vitals/Pain Pain Assessment: Faces Faces Pain Scale: Hurts even more Pain Location: back with initial transition Pain Intervention(s): Limited activity within patient's tolerance;Monitored during session;Repositioned    Home Living Family/patient expects to be discharged to:: Private residence Living Arrangements: Children Available Help at Discharge: Family;Available 24 hours/day Type of Home: House Home Access: Level entry     Home Layout: One level Home Equipment: Cane - single point;Walker - 2 wheels;Shower seat      Prior Function Level of Independence: Independent with assistive device(s)         Comments: Home alone during the day     Hand Dominance        Extremity/Trunk Assessment   Upper Extremity Assessment: Overall WFL for tasks assessed           Lower Extremity Assessment: Generalized weakness      Cervical / Trunk Assessment: Kyphotic  Communication  Communication: No difficulties;HOH  Cognition Arousal/Alertness: Lethargic Behavior During Therapy: Restless Overall Cognitive Status: Impaired/Different from baseline Area of Impairment: Following commands;Safety/judgement;Awareness;Problem solving     Memory: Decreased recall of precautions Following Commands: Follows one step commands with increased time Safety/Judgement: Decreased awareness of safety;Decreased awareness of deficits Awareness: Anticipatory;Intellectual Problem  Solving: Slow processing;Difficulty sequencing;Requires verbal cues;Requires tactile cues      General Comments General comments (skin integrity, edema, etc.): Pt is usually active and goes to work out daily, still driving regularly.  Has family support in evening as daughter works.  Daughter reports help can be available in evening if needed.    Exercises General Exercises - Lower Extremity Ankle Circles/Pumps: Both;5 reps;AAROM Long Arc Quad: AROM;Strengthening;Both;5 reps Heel Slides: AROM;Strengthening;Both;10 reps Hip ABduction/ADduction: AROM;Strengthening;Both;5 reps      Assessment/Plan    PT Assessment Patient needs continued PT services  PT Diagnosis Generalized weakness   PT Problem List Decreased strength;Decreased range of motion;Decreased activity tolerance;Decreased balance;Decreased mobility;Decreased coordination;Decreased cognition;Decreased safety awareness;Decreased skin integrity  PT Treatment Interventions DME instruction;Gait training;Functional mobility training;Therapeutic activities;Therapeutic exercise;Balance training;Neuromuscular re-education;Cognitive remediation;Patient/family education   PT Goals (Current goals can be found in the Care Plan section) Acute Rehab PT Goals Patient Stated Goal: Did not state PT Goal Formulation: With patient/family Time For Goal Achievement: 07/04/14 Potential to Achieve Goals: Good    Frequency Min 3X/week   Barriers to discharge Decreased caregiver support family works    Co-evaluation               End of Glass blower/designer During Treatment: Other (comment) (HHA due to short trip to chair) Activity Tolerance: Patient limited by fatigue;Patient limited by lethargy Patient left: in chair;with call bell/phone within reach;with chair alarm set;with family/visitor present Nurse Communication: Mobility status    Functional Assessment Tool Used: clinical judgment Functional Limitation: Mobility:  Walking and moving around Mobility: Walking and Moving Around Current Status [phone]): At least 60 percent but less than 80 percent impaired, limited or restricted Mobility: Walking and Moving Around Goal Status [phone]): At least 1 percent but less than 20 percent impaired, limited or restricted    Time: 1156-1220 PT Time Calculation (min) (ACUTE ONLY): 24 min   Charges:   PT Evaluation $Initial PT Evaluation Tier I: 1 Procedure PT Treatments $Therapeutic Activity: 8-22 mins   PT G Codes:   PT G-Codes **NOT FOR INPATIENT CLASS** Functional Assessment Tool Used: clinical judgment Functional Limitation: Mobility: Walking and moving around Mobility: Walking and Moving Around Current Status ([id number]): At least 60 percent but less than 80 percent impaired, limited or restricted Mobility: Walking and Moving Around Goal Status [phone]): At least 1 percent but less than 20 percent impaired, limited or restricted    Ramond Dial 06/20/2014, 1:33 PM   Mee Hives, PT MS Acute Rehab Dept. Number: [phone]

## 2014-06-20 NOTE — Progress Notes (Signed)
Clinical Social Work Department BRIEF PSYCHOSOCIAL ASSESSMENT 06/20/2014  Patient:  Derrick Andrews, Derrick Andrews     Account Number:  [id number]     Admit date:  06/19/2014  Clinical Social Worker:  Glorious Peach, CLINICAL SOCIAL WORKER  Date/Time:  06/20/2014 02:17 PM  Referred by:  Physician  Date Referred:  06/20/2014 Referred for  SNF Placement   Other Referral:   Interview type:  Patient Other interview type:   and daughter Derrick Andrews at bedside.    PSYCHOSOCIAL DATA Living Status:  ALONE Admitted from facility:   Level of care:   Primary support name:  Derrick Andrews Primary support relationship to patient:  CHILD, ADULT Degree of support available:   Adequate    CURRENT CONCERNS Current Concerns  Post-Acute Placement   Other Concerns:    SOCIAL WORK ASSESSMENT / PLAN CSW to fax out FL2 and other clinicals to [facility] for possible bed offers in case patient and patient's daughter becomes agreeable to SNF.   Assessment/plan status:   Other assessment/ plan:   Information/referral to community resources:   Patient declines SNF at this time.    PATIENT'S/FAMILY'S RESPONSE TO PLAN OF CARE: CSW spoke with patient and patient's daughter who was at bedside. CSW introduced and explained role. CSW informed patient and daughter, Derrick Andrews, that PT recommends that when patient is DC, that patient would go to a SNF for short term rehab. Patient declines offer at this time, daughter says that they need a little more time to think about it. CSW contacted Case manager, so that she could speak with patient and family. Case manager agreed to speak with patient and family. CSW will continue to follow.       Glorious Peach BSW Intern

## 2014-06-20 NOTE — Progress Notes (Signed)
UR completed 

## 2014-06-21 LAB — CBC
HEMATOCRIT: 37.4 % — AB (ref 39.0–52.0)
Hemoglobin: 12.1 g/dL — ABNORMAL LOW (ref 13.0–17.0)
MCH: 31.7 pg (ref 26.0–34.0)
MCHC: 32.4 g/dL (ref 30.0–36.0)
MCV: 97.9 fL (ref 78.0–100.0)
PLATELETS: 221 10*3/uL (ref 150–400)
RBC: 3.82 MIL/uL — ABNORMAL LOW (ref 4.22–5.81)
RDW: 14.3 % (ref 11.5–15.5)
WBC: 8.1 10*3/uL (ref 4.0–10.5)

## 2014-06-21 LAB — BASIC METABOLIC PANEL
ANION GAP: 7 (ref 5–15)
BUN: 13 mg/dL (ref 6–23)
CALCIUM: 8 mg/dL — AB (ref 8.4–10.5)
CO2: 26 mmol/L (ref 19–32)
CREATININE: 0.72 mg/dL (ref 0.50–1.35)
Chloride: 105 mEq/L (ref 96–112)
GFR calc Af Amer: 90 mL/min — ABNORMAL LOW (ref >90)
GFR, EST NON AFRICAN AMERICAN: 77 mL/min — AB (ref >90)
Glucose, Bld: 105 mg/dL — ABNORMAL HIGH (ref 70–99)
Potassium: 3.7 mmol/L (ref 3.5–5.1)
Sodium: 138 mmol/L (ref 135–145)

## 2014-06-21 LAB — EXPECTORATED SPUTUM ASSESSMENT W REFEX TO RESP CULTURE

## 2014-06-21 MED ORDER — AZITHROMYCIN 500 MG PO TABS
500.0000 mg | ORAL_TABLET | Freq: Every day | ORAL | Status: DC
  Administered 2014-06-21: 500 mg via ORAL
  Filled 2014-06-21 (×2): qty 1

## 2014-06-21 MED ORDER — CALCIUM CARBONATE ANTACID 500 MG PO CHEW
2.0000 | CHEWABLE_TABLET | Freq: Four times a day (QID) | ORAL | Status: DC | PRN
Start: 2014-06-21 — End: 2014-06-22
  Administered 2014-06-21: 400 mg via ORAL
  Filled 2014-06-21: qty 1

## 2014-06-21 NOTE — Evaluation (Signed)
Occupational Therapy Evaluation Patient Details Name: Derrick Andrews MRN: [id number] DOB: 07-18-1917 Today's Date: 06/21/2014    History of Present Illness 81 yo male with onset of cough and leukocytosis was admitted ffor hypoxia, and had PMHx:  CAD, HLD, GERD, prostate CA with incontinence, arteritis with L eye visual losss, HOH B    Clinical Impression   Pt admitted with cough. Pt currently with functional limitations due to the deficits listed below (see OT Problem List).  Pt will benefit from skilled OT to increase their safety and independence with ADL and functional mobility for ADL to facilitate discharge to venue listed below.      Follow Up Recommendations  SNF    Equipment Recommendations       Recommendations for Other Services       Precautions / Restrictions Precautions Precautions: Fall      Mobility Bed Mobility Overal bed mobility: Needs Assistance Bed Mobility: Rolling;Supine to Sit Rolling: Min assist   Supine to sit: Min assist        Transfers Overall transfer level: Needs assistance Equipment used: 1 person hand held assist Transfers: Sit to/from Stand Sit to Stand: Mod assist;Max assist Stand pivot transfers: Mod assist;Max assist            Balance Overall balance assessment: Needs assistance Sitting-balance support: Bilateral upper extremity supported;Feet supported Sitting balance-Leahy Scale: Good       Standing balance-Leahy Scale: Poor                              ADL Overall ADL's : Needs assistance/impaired Eating/Feeding: Minimal assistance;Sitting   Grooming: Minimal assistance;Sitting   Upper Body Bathing: Minimal assitance;Sitting   Lower Body Bathing: Sit to/from stand;Cueing for safety;Cueing for sequencing;Maximal assistance       Lower Body Dressing: Sit to/from stand;Maximal assistance   Toilet Transfer: Maximal assistance Toilet Transfer Details (indicate cue type and reason): sit to stand  only.  Pt had just gotten back to bed Toileting- Clothing Manipulation and Hygiene: Total assistance;Sit to/from stand         General ADL Comments: pt agreeable to ST SNF                  Hand Dominance     Extremity/Trunk Assessment Upper Extremity Assessment Upper Extremity Assessment: Generalized weakness           Communication Communication Communication: No difficulties;HOH   Cognition Arousal/Alertness: Awake/alert   Overall Cognitive Status: Within Functional Limits for tasks assessed                                Home Living Family/patient expects to be discharged to:: Private residence Living Arrangements: Children Available Help at Discharge: Family;Available 24 hours/day Type of Home: House Home Access: Level entry     Home Layout: One level     Bathroom Shower/Tub: Tub/shower unit         Home Equipment: Cane - single point;Walker - 2 wheels;Shower seat          Prior Functioning/Environment Level of Independence: Independent with assistive device(s)        Comments: Home alone during the day    OT Diagnosis: Generalized weakness   OT Problem List: Decreased strength;Decreased activity tolerance;Decreased safety awareness   OT Treatment/Interventions: Self-care/ADL training;DME and/or AE instruction;Patient/family education    OT Goals(Current goals can be found  in the care plan section) Acute Rehab OT Goals Patient Stated Goal: get stronger OT Goal Formulation: With patient Time For Goal Achievement: 07/05/14 Potential to Achieve Goals: Good  OT Frequency: Min 2X/week   Barriers to D/C:               End of Session    Activity Tolerance: Patient limited by fatigue Patient left: in bed;with call bell/phone within reach;with nursing/sitter in room   Time: 1220-1243 OT Time Calculation (min): 23 min Charges:  OT General Charges $OT Visit: 1 Procedure OT Evaluation $Initial OT Evaluation Tier I: 1  Procedure OT Treatments $Self Care/Home Management : 8-22 mins G-Codes:    Payton Mccallum D 07/06/14, 12:52 PM

## 2014-06-21 NOTE — Progress Notes (Signed)
CARE MANAGEMENT NOTE 06/21/2014  Patient:  Derrick Andrews, Derrick Andrews   Account Number:  [id number]  Date Initiated:  06/21/2014  Documentation initiated by:  Karl Bales  Subjective/Objective Assessment:   pt admitted with CAP     Action/Plan:   from home   Anticipated DC Date:  06/23/2014   Anticipated DC Plan:  [facility] referral  Clinical Social Worker      DC Planning Services  CM consult      Choice offered to / List presented to:             Status of service:  In process, will continue to follow Medicare Important Message given?  NA - LOS <3 / Initial given by admissions (If response is "NO", the following Medicare IM given date fields will be blank) Date Medicare IM given:  06/21/2014 Medicare IM given by:  Karl Bales Date Additional Medicare IM given:   Additional Medicare IM given by:    Discharge Disposition:    Per UR Regulation:  Reviewed for med. necessity/level of care/duration of stay  If discussed at [facility] of Stay Meetings, dates discussed:    Comments:  06/21/14 MMcGibboney, RN, BSN Pt now would like to go to Rehab/SNF.

## 2014-06-21 NOTE — Progress Notes (Signed)
[facility] PROGRESS NOTE  Derrick Andrews HFG:[id number] DOB: 04/30/1918 DOA: 06/19/2014 PCP: Horton Finer, MD  Summary/Subjective I have seen and examined Derrick Andrews at bedside and reviewed his chart in the presence of his caregiver. Derrick Andrews is a 81 y.o. male  with history of CAD, hyperlipidemia, GERD who came in with increasing weakness preceded by rhinorrhea and upper respiratory symptoms. He was found to have CAP with chest x-ray showing increased patchy airspace opacity in the right upper [place] [address]. White count has improved to 8100. He feels better but still has occasional cough. PT has recommended STR and patient is open to it. He can d/c to STR once arrangements made. Will continue current antibiotics for now and d/c IVF as he is eating ok. Plan CAP Patient is on Rocephin and azithromycin. Will continue for now to complete 7 days of antibiotics. Supportive management with bronchodilators, mucolytics, antitussives and oxygen as needed.  Generalized weakness Likely secondary to his CAP, treat pneumonia, get nutritionist to see the patient. PT/OT recommended rehab. Case Management working with patient and family.  Temporal arteritis Patient is on low-dose of prednisone at home, continue home dose of the steroid. Hemodynamically stable, don't think there is need for stress dose of steroid.  GERD Stable continue home medications.  Code Status: Full code Family Communication: Plan discussed with the patient. Disposition Plan: Remains inpatient   Consultants:  None  Procedures:  None  Antibiotics:  Ceftriaxone/Zithromax 06/19/13>   Objective: Filed Vitals:   06/21/14 1520  BP: 122/51  Pulse: 74  Temp: 98.3 F (36.8 C)  Resp: 19    Intake/Output Summary (Last 24 hours) at 06/21/14 1528 Last data filed at 06/21/14 1522  Gross per 24 hour  Intake   1340 ml  Output    800 ml  Net    540 ml   Filed Weights   06/19/14 2233   Weight: 76.068 kg (167 lb 11.2 oz)    Exam:   General:  Comfortable at rest.  Cardiovascular: S1S2 heard. No murmurs. RRR.  Respiratory: Good air entry bilaterally. No wheezes.  Abdomen: soft, non tender.+BS.  Musculoskeletal: No pedal edema.   Data Reviewed: Basic Metabolic Panel:  Recent Labs Lab 06/19/14 1435 06/19/14 1505 06/20/14 0500 06/21/14 0350  NA 132* 133* 135 138  K 4.1 4.0 3.6 3.7  CL 103 102 103 105  CO2 23  --  25 26  GLUCOSE 156* 153* 121* 105*  BUN 18 17 14 13   CREATININE 0.82 0.80 0.72 0.72  CALCIUM 8.5  --  7.9* 8.0*   Liver Function Tests:  Recent Labs Lab 06/19/14 1435  AST 32  ALT 24  ALKPHOS 71  BILITOT 1.1  PROT 6.5  ALBUMIN 3.4*   No results for input(s): LIPASE, AMYLASE in the last 168 hours. No results for input(s): AMMONIA in the last 168 hours. CBC:  Recent Labs Lab 06/19/14 1435 06/19/14 1505 06/20/14 0500 06/21/14 0350  WBC 15.8*  --  9.5 8.1  NEUTROABS 13.5*  --   --   --   HGB 13.6 15.3 12.2* 12.1*  HCT 40.6 45.0 36.4* 37.4*  MCV 96.9  --  96.8 97.9  PLT 261  --  214 221   Cardiac Enzymes:  Recent Labs Lab 06/19/14 1435  CKTOTAL 87  TROPONINI 0.03   BNP (last 3 results)  Recent Labs  06/29/13 1552  PROBNP 79.0   CBG: No results for input(s): GLUCAP in the last 168  hours.  Recent Results (from the past 240 hour(s))  Culture, blood (routine x 2) Call MD if unable to obtain prior to antibiotics being given     Status: None (Preliminary result)   Collection Time: 06/19/14 11:55 PM  Result Value Ref Range Status   Specimen Description BLOOD LEFT FOREARM  Final   Special Requests BOTTLES DRAWN AEROBIC ONLY 5ML  Final   Culture   Final           BLOOD CULTURE RECEIVED NO GROWTH TO DATE CULTURE WILL BE HELD FOR 5 DAYS BEFORE ISSUING A FINAL NEGATIVE REPORT Performed at [facility]    Report Status PENDING  Incomplete  Culture, blood (routine x 2) Call MD if unable to obtain prior to  antibiotics being given     Status: None (Preliminary result)   Collection Time: 06/19/14 11:58 PM  Result Value Ref Range Status   Specimen Description BLOOD LEFT ARM  Final   Special Requests BOTTLES DRAWN AEROBIC ONLY 5ML  Final   Culture   Final           BLOOD CULTURE RECEIVED NO GROWTH TO DATE CULTURE WILL BE HELD FOR 5 DAYS BEFORE ISSUING A FINAL NEGATIVE REPORT Performed at [facility]    Report Status PENDING  Incomplete  Culture, sputum-assessment     Status: None   Collection Time: 06/21/14  5:18 AM  Result Value Ref Range Status   Specimen Description SPUTUM  Final   Special Requests Immunocompromised  Final   Sputum evaluation   Final    THIS SPECIMEN IS ACCEPTABLE. RESPIRATORY CULTURE REPORT TO FOLLOW.   Report Status 06/21/2014 FINAL  Final     Studies: Dg Chest 2 View  06/19/2014   CLINICAL DATA:  Malaise.  Weakness.  Anorexia.  EXAM: CHEST  2 VIEW  COMPARISON:  04/01/2014 and 01/06/2014  FINDINGS: Diffuse chronic interstitial lung disease is again noted. Increased patchy airspace opacity is seen in the right upper lobe, suspicious for superimposed pneumonia.  Low lung volumes again noted. No evidence of pleural effusion. Heart size is within normal limits.  IMPRESSION: Diffuse chronic interstitial lung disease, with increased patchy airspace opacity in right upper lobe. Superimposed pneumonia cannot be excluded. Continued chest radiographic followup recommended.   Electronically Signed   By: Earle Gell M.D.   On: 06/19/2014 17:28    Scheduled Meds: . aspirin EC  81 mg Oral Daily  . azithromycin  500 mg Oral q1800  . cefTRIAXone (ROCEPHIN)  IV  1 g Intravenous Q24H  . feeding supplement (ENSURE COMPLETE)  237 mL Oral BID BM  . guaiFENesin  1,200 mg Oral BID  . heparin  5,000 Units Subcutaneous 3 times per day  . isosorbide mononitrate  60 mg Oral Daily  . oxybutynin  5 mg Oral BID  . pantoprazole  40 mg Oral Daily  . pravastatin  40 mg Oral QHS  .  predniSONE  2 mg Oral q morning - 10a   Continuous Infusions:   Principal Problem:   CAP (community acquired pneumonia) Active Problems:   Hyperlipidemia   Temporal arteritis   Coronary artery disease   GERD (gastroesophageal reflux disease)    Time spent: 15 minutes.    Derrick Andrews  [facility] Pager [phone]. If 7PM-7AM, please contact night-coverage at [email or web address], password [facility] 06/21/2014, 3:28 PM  LOS: 2 days

## 2014-06-21 NOTE — Progress Notes (Signed)
CSW spoke with patient's daughter, Derrick Andrews (cell#: [phone]) & confirmed that patient is now agreeable with plan for SNF at discharge. They accepted bed at [facility], confirmed with [name] at [place] that they would be able to take patient when ready. CSW has completed FL2 & will facilitate discharge when ready   Clinical Social Work Department CLINICAL SOCIAL WORK PLACEMENT NOTE 06/21/2014  Patient: DEMONTRE, Derrick Andrews Account Number: [id number] Admit date: 06/19/2014  Clinical Social Worker: Renold Genta Date/time: 06/21/2014 03:28 PM  Clinical Social Work is seeking post-discharge placement for this patient at the following level of care: [place] (*CSW will update this form in Epic as items are completed)   06/21/2014 Patient/family provided with [place] Department of Clinical Social Work's list of facilities offering this level of care within the geographic area requested by the patient (or if unable, by the patient's family).  06/21/2014 Patient/family informed of their freedom to choose among providers that offer the needed level of care, that participate in Medicare, Medicaid or managed care program needed by the patient, have an available bed and are willing to accept the patient.  06/21/2014 Patient/family informed of [facility]' ownership interest in [facility], as well as of the fact that they are under no obligation to receive care at this facility.  PASARR submitted to EDS on 06/21/2014 PASARR number received on 06/21/2014  FL2 transmitted to all facilities in geographic area requested by pt/family on 06/21/2014 FL2 transmitted to all facilities within larger geographic area on   Patient informed that his/her managed care company has contracts with or will negotiate with certain facilities, including the following:    Patient/family informed of bed offers received: 06/21/2014 Patient chooses bed at [facility]  recommends and patient chooses bed at   Patient to be transferred to [facility] on  Patient to be transferred to facility by  Patient and family notified of transfer on  Name of family member notified:   The following physician request were entered in Epic:   Additional Comments:   [name], [place] Social Worker cell #: [phone]

## 2014-06-21 NOTE — Progress Notes (Signed)
PHARMACIST - PHYSICIAN COMMUNICATION DR:   Sanjuana Letters CONCERNING: Antibiotic IV to Oral Route Change Policy  RECOMMENDATION: This patient is receiving Azithromycin by the intravenous route.  Based on criteria approved by the Pharmacy and Therapeutics Committee, the antibiotic(s) is/are being converted to the equivalent oral dose form(s).   DESCRIPTION: These criteria include:  Patient being treated for a respiratory tract infection, urinary tract infection, cellulitis or clostridium difficile associated diarrhea if on metronidazole  The patient is not neutropenic and does not exhibit a GI malabsorption state  The patient is eating (either orally or via tube) and/or has been taking other orally administered medications for a least 24 hours  The patient is improving clinically and has a Tmax < 100.5  If you have questions about this conversion, please contact the Pharmacy Department  []   [phone] )  [facility] []   [phone] )  [facility]  []   [phone] )  [facility] [x]   [phone] )  [place], PharmD, BCPS 06/21/2014, 11:16 AM  Pager: [phone]

## 2014-06-22 MED ORDER — CEPHALEXIN 500 MG PO CAPS: 500.0000 mg | ORAL_CAPSULE | Freq: Three times a day (TID) | ORAL | Status: DC

## 2014-06-22 MED ORDER — GUAIFENESIN ER 600 MG PO TB12: 1200.0000 mg | ORAL_TABLET | Freq: Two times a day (BID) | ORAL | Status: AC

## 2014-06-22 NOTE — Progress Notes (Signed)
Patient is set to discharge to [facility] today. Patient & daughter, Manuela Schwartz aware. Discharge packet given to RN, Melissa. Patient's daughter, Manuela Schwartz to transport to SNF.   Clinical Social Work Department CLINICAL SOCIAL WORK PLACEMENT NOTE 06/22/2014  Patient:  Derrick Andrews, Derrick Andrews  Account Number:  [id number] Admit date:  06/19/2014  Clinical Social Worker:  Renold Genta  Date/time:  06/21/2014 03:28 PM  Clinical Social Work is seeking post-discharge placement for this patient at the following level of care:   SKILLED NURSING   (*CSW will update this form in Epic as items are completed)   06/21/2014  Patient/family provided with [place] Department of Clinical Social Work's list of facilities offering this level of care within the geographic area requested by the patient (or if unable, by the patient's family).  06/21/2014  Patient/family informed of their freedom to choose among providers that offer the needed level of care, that participate in Medicare, Medicaid or managed care program needed by the patient, have an available bed and are willing to accept the patient.  06/21/2014  Patient/family informed of [facility]' ownership interest in [facility], as well as of the fact that they are under no obligation to receive care at this facility.  PASARR submitted to EDS on 06/21/2014 PASARR number received on 06/21/2014  FL2 transmitted to all facilities in geographic area requested by pt/family on  06/21/2014 FL2 transmitted to all facilities within larger geographic area on   Patient informed that his/her managed care company has contracts with or will negotiate with  certain facilities, including the following:     Patient/family informed of bed offers received:  06/21/2014 Patient chooses bed at [place] [facility]ician recommends and patient chooses bed at    Patient to be transferred to [place] on  06/22/2014 Patient to be transferred to facility  bypatient's daughter, Manuela Schwartz Patient and family notified of transfer on 06/22/2014 Name of family member notified:  patient's daughter, Manuela Schwartz  The following physician request were entered in Epic:   Additional Comments:   [name], [place] Social Worker cell #: [phone]

## 2014-06-22 NOTE — Progress Notes (Signed)
Report called to [facility] and given to [place]. Pt to be transported via dtr, per their request. AVS packet sent with pt and pt's dtr Manuela Schwartz, at discharge.

## 2014-06-22 NOTE — Progress Notes (Signed)
Report received from D. Owens Shark, RN. No change from initial pm assessment. Will continue to monitor and follow the POC.

## 2014-06-22 NOTE — Discharge Summary (Signed)
Derrick Andrews, is a 81 y.o. male  DOB 07/03/1917  MRN [id number].  Admission date:  06/19/2014  Admitting Physician  Velvet Bathe, MD  Discharge Date:  06/22/2014   Primary MD  Horton Finer, MD  Recommendations for primary care physician for things to follow:   Please follow complete resolution of pneumonia.   Admission Diagnosis  Weakness [R53.1] CAP (community acquired pneumonia) [J18.9]   Discharge Diagnosis  Weakness [R53.1] CAP (community acquired pneumonia) [J18.9]    Active Problems:   Hyperlipidemia   Temporal arteritis   Coronary artery disease   GERD (gastroesophageal reflux disease)      Past Medical History  Diagnosis Date  . Hyperlipidemia   . Coronary artery disease ? 2010    a. hx LAD stent in 2003.  Marland Kitchen Cancer     a. 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  . Urinary frequency     AND SOMETIMES CAN'T TELL WHEN URINE IS COMING - DEPENDS AS NEEDED  . GERD (gastroesophageal reflux disease)   . Arthritis     IN MY BACK  . Hearing problem of both ears     WEARS BILATERAL HEARING AIDS  . Blind left eye     DIAGNOSED WITH ? TEMPORAL ARTERITIS - AND PUT ON PREDNISONE TO PREVENT BLINDNESS IN RIGHT EYE  . Unsteady gait     USES CANE WHEN AMBULATING  . Temporal arteritis     a. Biopsy proven per records.    Past Surgical History  Procedure Laterality Date  . Varicose veing stripping in 1950s    . Coronary angioplasty      HEART STENTING  . Tonsillectomy    . Transurethral resection of prostate N/A 01/19/2014    Procedure: TRANSURETHRAL RESECTION OF BLADDER CANCER INCASIVE INTO RIGHT PROSTATE (TURP) AND RESECTION OF SUPERFICIALBLADDER CANCER OF TRIGONE AND BLADDER NECK;  Surgeon: Ailene Rud, MD;  Location: [facility];  Service: Urology;   Laterality: N/A;       History of present illness and  Hospital Course:     Kindly see H&P for history of present illness and admission details, please review complete Labs, Consult reports and Test reports for all details in brief  HPI  from the history and physical done on the day of admission    [place] is a 81 y.o. male with history of CAD, hyperlipidemia, GERD who came in with increasing weakness preceded by rhinorrhea and upper respiratory symptoms. He was found to have CAP with chest x-ray showing increased patchy airspace opacity in the right upper lobe and [place] of [address]. White count has improved to 8100 oh with antibiotic treatments for CAP(Ceftriaxone/Zithromax). He feels better but still has occasional cough. PT has recommended STR and patient is open to it. He will discharge to skilled nursing facility today. I discuss the discharge plan with patient and his daughter at bedside. He should complete 3 days of Keflex and the skilled nursing facility. Discharge Condition:  Stable Follow UP   PCP at rehabilitation facility   Discharge Instructions  and  Discharge Medications    Discharge Instructions    Diet - low sodium heart healthy    Complete by:  As directed      Increase activity slowly    Complete by:  As directed             Medication List    STOP taking these medications        ROBITUSSIN PEAK COLD DM PO      TAKE these medications        acetaminophen 325 MG tablet  Commonly known as:  TYLENOL  Take 650 mg by mouth every 6 (six) hours as needed for moderate pain (arthritis pain).     aspirin 81 MG tablet  Take 81 mg by mouth daily.     calcium carbonate 500 MG chewable tablet  Commonly known as:  TUMS - dosed in mg elemental calcium  Chew 1 tablet by mouth daily.     cephALEXin 500 MG capsule  Commonly known as:  KEFLEX  Take 1 capsule (500 mg total) by mouth 3 (three) times daily.     cyanocobalamin 1000  MCG/ML injection  Commonly known as:  (VITAMIN B-12)  Inject 1,000 mcg into the muscle every 30 (thirty) days.     guaiFENesin 600 MG 12 hr tablet  Commonly known as:  MUCINEX  Take 2 tablets (1,200 mg total) by mouth 2 (two) times daily.     isosorbide mononitrate 60 MG 24 hr tablet  Commonly known as:  IMDUR  Take 1 tablet (60 mg total) by mouth daily.     nitroGLYCERIN 0.4 MG SL tablet  Commonly known as:  NITROSTAT  Place 1 tablet (0.4 mg total) under the tongue every 5 (five) minutes as needed for chest pain.     omeprazole 20 MG capsule  Commonly known as:  PRILOSEC  Take 20 mg by mouth daily.     oxybutynin 5 MG tablet  Commonly known as:  DITROPAN  Take 5 mg by mouth 2 (two) times daily.     pravastatin 40 MG tablet  Commonly known as:  PRAVACHOL  Take 40 mg by mouth at bedtime.     predniSONE 1 MG tablet  Commonly known as:  DELTASONE  Take 2 mg by mouth every morning.          Diet and Activity recommendation: See Discharge Instructions above   Consults obtained - none   Major procedures and Radiology Reports - PLEASE review detailed and final reports for all details, in brief -    Dg Chest 2 View  06/19/2014   CLINICAL DATA:  Malaise.  Weakness.  Anorexia.  EXAM: CHEST  2 VIEW  COMPARISON:  04/01/2014 and 01/06/2014  FINDINGS: Diffuse chronic interstitial lung disease is again noted. Increased patchy airspace opacity is seen in the right upper lobe, suspicious for superimposed pneumonia.  Low lung volumes again noted. No evidence of pleural effusion. Heart size is within normal limits.  IMPRESSION: Diffuse chronic interstitial lung disease, with increased patchy airspace opacity in right upper lobe. Superimposed pneumonia cannot be excluded. Continued chest radiographic followup recommended.   Electronically Signed   By: Earle Gell M.D.   On: 06/19/2014 17:28    Micro Results    Recent Results (from the past 240 hour(s))  Culture, blood (routine x 2)  Call MD if unable to obtain prior to antibiotics being given  Status: None (Preliminary result)   Collection Time: 06/19/14 11:55 PM  Result Value Ref Range Status   Specimen Description BLOOD LEFT FOREARM  Final   Special Requests BOTTLES DRAWN AEROBIC ONLY 5ML  Final   Culture   Final           BLOOD CULTURE RECEIVED NO GROWTH TO DATE CULTURE WILL BE HELD FOR 5 DAYS BEFORE ISSUING A FINAL NEGATIVE REPORT Performed at [facility]    Report Status PENDING  Incomplete  Culture, blood (routine x 2) Call MD if unable to obtain prior to antibiotics being given     Status: None (Preliminary result)   Collection Time: 06/19/14 11:58 PM  Result Value Ref Range Status   Specimen Description BLOOD LEFT ARM  Final   Special Requests BOTTLES DRAWN AEROBIC ONLY 5ML  Final   Culture   Final           BLOOD CULTURE RECEIVED NO GROWTH TO DATE CULTURE WILL BE HELD FOR 5 DAYS BEFORE ISSUING A FINAL NEGATIVE REPORT Performed at [facility]    Report Status PENDING  Incomplete  Culture, sputum-assessment     Status: None   Collection Time: 06/21/14  5:18 AM  Result Value Ref Range Status   Specimen Description SPUTUM  Final   Special Requests Immunocompromised  Final   Sputum evaluation   Final    THIS SPECIMEN IS ACCEPTABLE. RESPIRATORY CULTURE REPORT TO FOLLOW.   Report Status 06/21/2014 FINAL  Final  Culture, respiratory (NON-Expectorated)     Status: None (Preliminary result)   Collection Time: 06/21/14  5:18 AM  Result Value Ref Range Status   Specimen Description SPUTUM  Final   Special Requests NONE  Final   Gram Stain   Final    FEW WBC PRESENT,BOTH PMN AND MONONUCLEAR RARE SQUAMOUS EPITHELIAL CELLS PRESENT FEW GRAM POSITIVE RODS FEW GRAM NEGATIVE RODS RARE GRAM POSITIVE COCCI    Culture   Final    Culture reincubated for better growth Performed at [facility]    Report Status PENDING  Incomplete       Today   Subjective:   Derrick Andrews today  has no headache,no chest abdominal pain,no new weakness tingling or numbness, feels much better wants to go home today.  Objective:   Blood pressure 147/68, pulse 82, temperature 98 F (36.7 C), temperature source Oral, resp. rate 20, height 6\' 3"  (1.905 m), weight 76.068 kg (167 lb 11.2 oz), SpO2 93 %.   Intake/Output Summary (Last 24 hours) at 06/22/14 1244 Last data filed at 06/22/14 0752  Gross per 24 hour  Intake    170 ml  Output   1400 ml  Net  -1230 ml    Exam Awake Alert, Oriented x 3, No new F.N deficits, Normal affect [place].AT,PERRAL Supple Neck,No JVD, No cervical lymphadenopathy appriciated.  Symmetrical Chest wall movement, Good air movement bilaterally, CTAB RRR,No Gallops,Rubs or new Murmurs, No Parasternal Heave +ve B.Sounds, Abd Soft, Non tender, No organomegaly appriciated, No rebound -guarding or rigidity. No Cyanosis, Clubbing or edema, No new Rash or bruise  Data Review   CBC w Diff: Lab Results  Component Value Date   WBC 8.1 06/21/2014   HGB 12.1* 06/21/2014   HCT 37.4* 06/21/2014   PLT 221 06/21/2014   LYMPHOPCT 6* 06/19/2014   MONOPCT 8 06/19/2014   EOSPCT 1 06/19/2014   BASOPCT 0 06/19/2014    CMP: Lab Results  Component Value Date   NA 138  06/21/2014   K 3.7 06/21/2014   CL 105 06/21/2014   CO2 26 06/21/2014   BUN 13 06/21/2014   CREATININE 0.72 06/21/2014   PROT 6.5 06/19/2014   ALBUMIN 3.4* 06/19/2014   BILITOT 1.1 06/19/2014   ALKPHOS 71 06/19/2014   AST 32 06/19/2014   ALT 24 06/19/2014  .   Total Time in preparing paper work, data evaluation and todays exam - 25 minutes  Taft Worthing M.D on 06/22/2014 at 12:44 PM  [facility] Office  [phone]

## 2014-06-22 NOTE — Progress Notes (Signed)
CSW spoke with patient's daughter, Derrick Andrews (cell#: [phone]) & confirmed that patient is now agreeable with plan for SNF at discharge. They accepted bed at [facility], confirmed with [name] at [place] that they would be able to take patient when ready. CSW has completed FL2 & will facilitate discharge when ready   Clinical Social Work Department CLINICAL SOCIAL WORK PLACEMENT NOTE 06/21/2014  Patient: Derrick Andrews, Derrick Andrews Account Number: [id number] Admit date: 06/19/2014  Clinical Social Worker: Renold Genta Date/time: 06/21/2014 03:28 PM  Clinical Social Work is seeking post-discharge placement for this patient at the following level of care: [place] (*CSW will update this form in Epic as items are completed)   06/21/2014 Patient/family provided with [place] Department of Clinical Social Work's list of facilities offering this level of care within the geographic area requested by the patient (or if unable, by the patient's family).  06/21/2014 Patient/family informed of their freedom to choose among providers that offer the needed level of care, that participate in Medicare, Medicaid or managed care program needed by the patient, have an available bed and are willing to accept the patient.  06/21/2014 Patient/family informed of [facility]' ownership interest in [facility], as well as of the fact that they are under no obligation to receive care at this facility.  PASARR submitted to EDS on 06/21/2014 PASARR number received on 06/21/2014  FL2 transmitted to all facilities in geographic area requested by pt/family on 06/21/2014 FL2 transmitted to all facilities within larger geographic area on   Patient informed that his/her managed care company has contracts with or will negotiate with certain facilities, including the following:    Patient/family informed of bed offers received: 06/21/2014 Patient chooses bed at [place] [facility]  recommends and patient chooses bed at   Patient to be transferred to [place] on  Patient to be transferred to facility by  Patient and family notified of transfer on  Name of family member notified:   The following physician request were entered in Epic:   Additional Comments:   [name], [place] Social Worker cell #: [phone]

## 2014-06-23 ENCOUNTER — Non-Acute Institutional Stay (SKILLED_NURSING_FACILITY): Payer: Medicare Other | Admitting: Internal Medicine

## 2014-06-23 DIAGNOSIS — K5900 Constipation, unspecified: Secondary | ICD-10-CM

## 2014-06-23 DIAGNOSIS — R531 Weakness: Secondary | ICD-10-CM

## 2014-06-23 DIAGNOSIS — I2510 Atherosclerotic heart disease of native coronary artery without angina pectoris: Secondary | ICD-10-CM

## 2014-06-23 DIAGNOSIS — J189 Pneumonia, unspecified organism: Secondary | ICD-10-CM

## 2014-06-23 DIAGNOSIS — M316 Other giant cell arteritis: Secondary | ICD-10-CM

## 2014-06-23 DIAGNOSIS — K219 Gastro-esophageal reflux disease without esophagitis: Secondary | ICD-10-CM

## 2014-06-23 DIAGNOSIS — N3281 Overactive bladder: Secondary | ICD-10-CM

## 2014-06-23 LAB — CULTURE, RESPIRATORY: CULTURE: NORMAL

## 2014-06-23 LAB — CULTURE, RESPIRATORY W GRAM STAIN

## 2014-06-23 NOTE — Progress Notes (Signed)
Patient ID: Derrick Andrews, male   DOB: 11/21/1917, 81 y.o.   MRN: [id number]     [facility]   PCP: Horton Finer, MD  Code Status:full code  Allergies  Allergen Reactions  . Mercury Ammoniated [Ammoniated Mercury] Other (See Comments)    Blisters    Chief Complaint  Patient presents with  . New Admit To SNF     HPI:  81 year old patient is here for short term rehabilitation post hospital admission from 06/19/14-06/22/14 with generalized weakness and community acquired pneumonia. He is on keflex for treatment. He is seen in his room. He has not had a bowel movement for a week. He is passing flatus. He mentions maalox works for him. He still has some cough. He feels tired. He denies any other concerns.   Review of Systems:  Constitutional: Negative for fever, chills, diaphoresis.  HENT: Negative for headache, congestion, nasal discharge, hearing loss, earache, sore throat, difficulty swallowing.   Eyes: Negative for blurred vision, double vision and discharge.  Respiratory: Negative for shortness of breath and wheezing.   Cardiovascular: Negative for chest pain, palpitations, leg swelling.  Gastrointestinal: Negative for heartburn, nausea, vomiting, abdominal pain Genitourinary: Negative for dysuria.  Musculoskeletal: positive for generalized weakness.   Skin: Negative for itching, rash.  Neurological: Negative for dizziness, tingling, focal weakness Psychiatric/Behavioral: Negative for depression   Past Medical History  Diagnosis Date  . Hyperlipidemia   . Coronary artery disease ? 2010    a. hx LAD stent in 2003.  Marland Kitchen Cancer     a. 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  . Urinary frequency     AND SOMETIMES CAN'T TELL WHEN URINE IS COMING - DEPENDS AS NEEDED  . GERD (gastroesophageal reflux disease)   . Arthritis     IN MY BACK  . Hearing problem of both ears     WEARS BILATERAL  HEARING AIDS  . Blind left eye     DIAGNOSED WITH ? TEMPORAL ARTERITIS - AND PUT ON PREDNISONE TO PREVENT BLINDNESS IN RIGHT EYE  . Unsteady gait     USES CANE WHEN AMBULATING  . Temporal arteritis     a. Biopsy proven per records.   Past Surgical History  Procedure Laterality Date  . Varicose veing stripping in 1950s    . Coronary angioplasty      HEART STENTING  . Tonsillectomy    . Transurethral resection of prostate N/A 01/19/2014    Procedure: TRANSURETHRAL RESECTION OF BLADDER CANCER INCASIVE INTO RIGHT PROSTATE (TURP) AND RESECTION OF SUPERFICIALBLADDER CANCER OF TRIGONE AND BLADDER NECK;  Surgeon: Ailene Rud, MD;  Location: [facility];  Service: Urology;  Laterality: N/A;   Social History:   reports that he quit smoking about 41 years ago. He has never used smokeless tobacco. He reports that he drinks alcohol. He reports that he does not use illicit drugs.  Family History  Problem Relation Age of Onset  . CAD Father   . Heart attack Neg Hx   . Stroke Neg Hx   . Hypertension Mother   . Hypertension Father     Medications: Patient's Medications  New Prescriptions   No medications on file  Previous Medications   ACETAMINOPHEN (TYLENOL) 325 MG TABLET    Take 650 mg by mouth every 6 (six) hours as needed for moderate pain (arthritis pain).   ASPIRIN 81 MG TABLET    Take 81 mg by  mouth daily.   CALCIUM CARBONATE (TUMS - DOSED IN MG ELEMENTAL CALCIUM) 500 MG CHEWABLE TABLET    Chew 1 tablet by mouth daily.   CEPHALEXIN (KEFLEX) 500 MG CAPSULE    Take 1 capsule (500 mg total) by mouth 3 (three) times daily.   CYANOCOBALAMIN (,VITAMIN B-12,) 1000 MCG/ML INJECTION    Inject 1,000 mcg into the muscle every 30 (thirty) days.   GUAIFENESIN (MUCINEX) 600 MG 12 HR TABLET    Take 2 tablets (1,200 mg total) by mouth 2 (two) times daily.   ISOSORBIDE MONONITRATE (IMDUR) 60 MG 24 HR TABLET    Take 1 tablet (60 mg total) by mouth daily.   NITROGLYCERIN (NITROSTAT) 0.4 MG SL TABLET     Place 1 tablet (0.4 mg total) under the tongue every 5 (five) minutes as needed for chest pain.   OMEPRAZOLE (PRILOSEC) 20 MG CAPSULE    Take 20 mg by mouth daily.   OXYBUTYNIN (DITROPAN) 5 MG TABLET    Take 5 mg by mouth 2 (two) times daily.   PRAVASTATIN (PRAVACHOL) 40 MG TABLET    Take 40 mg by mouth at bedtime.    PREDNISONE (DELTASONE) 1 MG TABLET    Take 2 mg by mouth every morning.   Modified Medications   No medications on file  Discontinued Medications   No medications on file     Physical Exam: Filed Vitals:   06/23/14 1503  BP: 124/68  Pulse: 92  Temp: 98.6 F (37 C)  Resp: 18  SpO2: 93%    General- elderly male, in no acute distress Head- normocephalic, atraumatic Throat- moist mucus membrane Neck- no cervical lymphadenopathy Cardiovascular- normal s1,s2, no murmurs, palpable dorsalis pedis, no leg edema Respiratory- bilateral poor air entry and wheezing present, no rhonchi, no crackles, no use of accessory muscles Abdomen- bowel sounds present, soft, non tender Musculoskeletal- able to move all 4 extremities, generalized weakness Neurological- no focal deficit Skin- warm and dry Psychiatry- alert and oriented, normal mood and affect    Labs reviewed: Basic Metabolic Panel:  Recent Labs  06/19/14 1435 06/19/14 1505 06/20/14 0500 06/21/14 0350  NA 132* 133* 135 138  K 4.1 4.0 3.6 3.7  CL 103 102 103 105  CO2 23  --  25 26  GLUCOSE 156* 153* 121* 105*  BUN 18 17 14 13   CREATININE 0.82 0.80 0.72 0.72  CALCIUM 8.5  --  7.9* 8.0*   Liver Function Tests:  Recent Labs  06/19/14 1435  AST 32  ALT 24  ALKPHOS 71  BILITOT 1.1  PROT 6.5  ALBUMIN 3.4*   No results for input(s): LIPASE, AMYLASE in the last 8760 hours. No results for input(s): AMMONIA in the last 8760 hours. CBC:  Recent Labs  06/29/13 1552  05/15/14 1144 06/19/14 1435 06/19/14 1505 06/20/14 0500 06/21/14 0350  WBC 9.0  < > 11.0* 15.8*  --  9.5 8.1  NEUTROABS 6.6  --   9.0* 13.5*  --   --   --   HGB 13.9  < > 14.5 13.6 15.3 12.2* 12.1*  HCT 41.3  < > 44.4 40.6 45.0 36.4* 37.4*  MCV 96.8  < > 97.8 96.9  --  96.8 97.9  PLT 209.0  < > 195.0 261  --  214 221  < > = values in this interval not displayed. Cardiac Enzymes:  Recent Labs  06/19/14 1435  CKTOTAL 87  TROPONINI 0.03    Assessment/Plan  Generalized weakness Will have him  work with physical therapy and occupational therapy team to help with gait training and muscle strengthening exercises.fall precautions. Skin care. Encourage to be out of bed.   CAP Continue and comple keflex on 06/24/14. Add duoneb q8h for 3 days, then prn. Continue mucinex bid prn  CAD Remains chest pain free. Continue imdur 60 mg daily and pravachol 40 mg daily with prn NTG  Constipation Add colace 100 mg bid and malox 30 ml bid prn and reassess  gerd symptoms controlled, continue prilosec 20 mg daily and tums  OAB Continue oxybutynin 5 mg bid  Temporal arteritis Continue prednisone 2 mg daily   Goals of care: short term rehabilitation    Labs/tests ordered: none  Family/ staff Communication: reviewed care plan with patient and nursing supervisor    Blanchie Serve, MD  [facility] [phone] (Monday-Friday 8 am - 5 pm) [phone] (afterhours)

## 2014-06-26 LAB — CULTURE, BLOOD (ROUTINE X 2)
CULTURE: NO GROWTH
CULTURE: NO GROWTH

## 2014-07-05 ENCOUNTER — Encounter: Payer: Self-pay | Admitting: Adult Health

## 2014-07-05 ENCOUNTER — Non-Acute Institutional Stay (SKILLED_NURSING_FACILITY): Payer: Medicare Other | Admitting: Adult Health

## 2014-07-05 DIAGNOSIS — I2510 Atherosclerotic heart disease of native coronary artery without angina pectoris: Secondary | ICD-10-CM

## 2014-07-05 DIAGNOSIS — K5900 Constipation, unspecified: Secondary | ICD-10-CM

## 2014-07-05 DIAGNOSIS — R531 Weakness: Secondary | ICD-10-CM

## 2014-07-05 DIAGNOSIS — J189 Pneumonia, unspecified organism: Secondary | ICD-10-CM

## 2014-07-05 DIAGNOSIS — E785 Hyperlipidemia, unspecified: Secondary | ICD-10-CM

## 2014-07-05 DIAGNOSIS — K219 Gastro-esophageal reflux disease without esophagitis: Secondary | ICD-10-CM

## 2014-07-05 DIAGNOSIS — K649 Unspecified hemorrhoids: Secondary | ICD-10-CM

## 2014-07-05 DIAGNOSIS — N3281 Overactive bladder: Secondary | ICD-10-CM

## 2014-07-05 DIAGNOSIS — M316 Other giant cell arteritis: Secondary | ICD-10-CM

## 2014-07-05 DIAGNOSIS — D72829 Elevated white blood cell count, unspecified: Secondary | ICD-10-CM

## 2014-07-05 NOTE — Progress Notes (Signed)
Patient ID: Derrick Andrews, male   DOB: 05-11-1918, 81 y.o.   MRN: [id number]   07/05/2014  Facility:  [facility] Location:  [place] Room Number: 174-B LEVEL OF CARE:  SNF (31)   Chief Complaint  Patient presents with  . Discharge Note    Generalized weakness, pneumonia, CAD, constipation, GERD, overactive bladder and temporal arteritis    HISTORY OF PRESENT ILLNESS:  This is a 81 year old male who is for discharge home with home health PT, OT and nursing. He has PMF of hyperlipidemia, CAD and temporal arteritis. He has been admitted to [facility] on 06/22/14 from [facility] with generalized weakness and community acquired pneumonia. Patient was admitted to this facility for short-term rehabilitation after the patient's recent hospitalization.  Patient has completed SNF rehabilitation and therapy has cleared the patient for discharge.  PAST MEDICAL HISTORY:  Past Medical History  Diagnosis Date  . Hyperlipidemia   . Coronary artery disease ? 2010    a. hx LAD stent in 2003.  Marland Kitchen Cancer     a. 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  . Urinary frequency     AND SOMETIMES CAN'T TELL WHEN URINE IS COMING - DEPENDS AS NEEDED  . GERD (gastroesophageal reflux disease)   . Arthritis     IN MY BACK  . Hearing problem of both ears     WEARS BILATERAL HEARING AIDS  . Blind left eye     DIAGNOSED WITH ? TEMPORAL ARTERITIS - AND PUT ON PREDNISONE TO PREVENT BLINDNESS IN RIGHT EYE  . Unsteady gait     USES CANE WHEN AMBULATING  . Temporal arteritis     a. Biopsy proven per records.    CURRENT MEDICATIONS: Reviewed per MAR/see medication list  Allergies  Allergen Reactions  . Mercury Ammoniated [Ammoniated Mercury] Other (See Comments)    Blisters     REVIEW OF SYSTEMS:  GENERAL: no change in appetite, no fatigue, no weight changes, no fever, chills or weakness RESPIRATORY: no cough, SOB,  DOE, wheezing, hemoptysis CARDIAC: no chest pain, edema or palpitations GI: no abdominal pain, diarrhea, constipation, heart burn, nausea or vomiting  PHYSICAL EXAMINATION  GENERAL: no acute distress, normal body habitus EYES: conjunctivae normal, sclerae normal, normal eye lids NECK: supple, trachea midline, no neck masses, no thyroid tenderness, no thyromegaly LYMPHATICS: no LAN in the neck, no supraclavicular LAN RESPIRATORY: breathing is even & unlabored, BS CTAB CARDIAC: RRR, no murmur,no extra heart sounds, no edema GI: abdomen soft, normal BS, no masses, no tenderness, no hepatomegaly, no splenomegaly EXTREMITIES: Able to move 4 extremities PSYCHIATRIC: the patient is alert & oriented to person, affect & behavior appropriate  LABS/RADIOLOGY: 07/03/14  WBC 11.3 hemoglobin 13.7 hematocrit 41.4 MCV 99.0 sodium 134 potassium 4.2 glucose 147 BUN 18 creatinine 0.8 calcium 8.6 total protein 5.8 albumin 3.4 ALP 59 AST 16 ALT 13 GFR>60 Labs reviewed: Basic Metabolic Panel:  Recent Labs  06/19/14 1435 06/19/14 1505 06/20/14 0500 06/21/14 0350  NA 132* 133* 135 138  K 4.1 4.0 3.6 3.7  CL 103 102 103 105  CO2 23  --  25 26  GLUCOSE 156* 153* 121* 105*  BUN 18 17 14 13   CREATININE 0.82 0.80 0.72 0.72  CALCIUM 8.5  --  7.9* 8.0*   Liver Function Tests:  Recent Labs  06/19/14 1435  AST 32  ALT 24  ALKPHOS 71  BILITOT 1.1  PROT 6.5  ALBUMIN 3.4*   CBC:  Recent Labs  05/15/14 1144 06/19/14 1435 06/19/14 1505 06/20/14 0500 06/21/14 0350  WBC 11.0* 15.8*  --  9.5 8.1  NEUTROABS 9.0* 13.5*  --   --   --   HGB 14.5 13.6 15.3 12.2* 12.1*  HCT 44.4 40.6 45.0 36.4* 37.4*  MCV 97.8 96.9  --  96.8 97.9  PLT 195.0 261  --  214 221   Cardiac Enzymes:  Recent Labs  06/19/14 1435  CKTOTAL 87  TROPONINI 0.03   Dg Chest 2 View  06/19/2014   CLINICAL DATA:  Malaise.  Weakness.  Anorexia.  EXAM: CHEST  2 VIEW  COMPARISON:  04/01/2014 and 01/06/2014  FINDINGS: Diffuse  chronic interstitial lung disease is again noted. Increased patchy airspace opacity is seen in the right upper lobe, suspicious for superimposed pneumonia.  Low lung volumes again noted. No evidence of pleural effusion. Heart size is within normal limits.  IMPRESSION: Diffuse chronic interstitial lung disease, with increased patchy airspace opacity in right upper lobe. Superimposed pneumonia cannot be excluded. Continued chest radiographic followup recommended.   Electronically Signed   By: Earle Gell M.D.   On: 06/19/2014 17:28    ASSESSMENT/PLAN:  Generalized weakness - for HOme health PT, OT and Nursing Community-acquired pneumonia - resolved; completed antibiotic treatment CAD - stable; continue aspirin 81 mg 1 tab by mouth daily Constipation - stable; continue Colace 100 mg by mouth twice a day Hemorrhoids - continue Anusol cream topically to external hemorrhoids 4 times a day when necessary GERD - no complaints of abdominal pain; continue Prilosec 20 mg by mouth daily Overactive bladder - continue oxybutynin 5 mg by mouth twice a day Temporal arteritis - continue prednisone 1 mg take 2 tabs = 2 mg by mouth every morning Hyperlipidemia - continue Pravachol 40 mg by mouth daily at bedtime Leukocytosis - currently on Prednisone; check cbc    I have filled out patient's discharge paperwork and written prescriptions.  Patient will receive home health PT, OT, ST and Nursing.  Total discharge time: Greater than 30 minutes  Discharge time involved coordination of the discharge process with social worker, nursing staff and therapy department. Medical justification for home health services verified.    [facility], NP [employer] [phone]

## 2014-07-13 ENCOUNTER — Other Ambulatory Visit: Payer: Self-pay | Admitting: Nurse Practitioner

## 2014-07-13 ENCOUNTER — Ambulatory Visit
Admission: RE | Admit: 2014-07-13 | Discharge: 2014-07-13 | Disposition: A | Discharge status: Home / Self Care | Payer: Medicare Other | Source: Ambulatory Visit | Attending: Nurse Practitioner | Admitting: Nurse Practitioner

## 2014-07-13 DIAGNOSIS — J189 Pneumonia, unspecified organism: Secondary | ICD-10-CM

## 2014-07-18 ENCOUNTER — Ambulatory Visit (INDEPENDENT_AMBULATORY_CARE_PROVIDER_SITE_OTHER): Payer: Medicare Other | Admitting: Interventional Cardiology

## 2014-07-18 ENCOUNTER — Encounter: Payer: Self-pay | Admitting: Interventional Cardiology

## 2014-07-18 VITALS — BP 114/68 | HR 78 | Wt 165.4 lb

## 2014-07-18 DIAGNOSIS — R0600 Dyspnea, unspecified: Secondary | ICD-10-CM

## 2014-07-18 DIAGNOSIS — I2510 Atherosclerotic heart disease of native coronary artery without angina pectoris: Secondary | ICD-10-CM

## 2014-07-18 DIAGNOSIS — E785 Hyperlipidemia, unspecified: Secondary | ICD-10-CM

## 2014-07-18 NOTE — Patient Instructions (Signed)
Your physician recommends that you continue on your current medications as directed. Please refer to the Current Medication list given to you today.   Your physician recommends that you schedule a follow-up appointment as needed  

## 2014-07-18 NOTE — Progress Notes (Signed)
Patient ID: Derrick Andrews, male   DOB: [birth year], 81 y.o.   MRN: [id number]    Cardiology Office Note   Date:  07/18/2014   ID:  Derrick Andrews, DOB 1917-08-04, MRN [id number]  PCP:  Horton Finer, MD  Cardiologist:   Sinclair Grooms, MD   No chief complaint on file.     History of Present Illness: Derrick Andrews is a 81 y.o. male who presents for coronary artery disease follow-up. He has no chest discomfort. He was seen by extended as in late January. He was automatically set up follow-up with me. He has had no recurrence of chest pain since his emergency room visit. Overall he has no complaints. Tachycardia mention by Richardson Dopp is no longer present.    Past Medical History  Diagnosis Date  . Hyperlipidemia   . Coronary artery disease ? 2010    a. hx LAD stent in 2003.  Marland Kitchen Cancer     a. 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  . Urinary frequency     AND SOMETIMES CAN'T TELL WHEN URINE IS COMING - DEPENDS AS NEEDED  . GERD (gastroesophageal reflux disease)   . Arthritis     IN MY BACK  . Hearing problem of both ears     WEARS BILATERAL HEARING AIDS  . Blind left eye     DIAGNOSED WITH ? TEMPORAL ARTERITIS - AND PUT ON PREDNISONE TO PREVENT BLINDNESS IN RIGHT EYE  . Unsteady gait     USES CANE WHEN AMBULATING  . Temporal arteritis     a. Biopsy proven per records.    Past Surgical History  Procedure Laterality Date  . Varicose veing stripping in 1950s    . Coronary angioplasty      HEART STENTING  . Tonsillectomy    . Transurethral resection of prostate N/A 01/19/2014    Procedure: TRANSURETHRAL RESECTION OF BLADDER CANCER INCASIVE INTO RIGHT PROSTATE (TURP) AND RESECTION OF SUPERFICIALBLADDER CANCER OF TRIGONE AND BLADDER NECK;  Surgeon: Ailene Rud, MD;  Location: [facility];  Service: Urology;  Laterality: N/A;     Current Outpatient Prescriptions  Medication Sig Dispense Refill  . acetaminophen  (TYLENOL) 325 MG tablet Take 650 mg by mouth every 6 (six) hours as needed for moderate pain (arthritis pain).    Marland Kitchen aspirin 81 MG tablet Take 81 mg by mouth daily.    . calcium carbonate (TUMS - DOSED IN MG ELEMENTAL CALCIUM) 500 MG chewable tablet Chew 1 tablet by mouth daily.    . cyanocobalamin (,VITAMIN B-12,) 1000 MCG/ML injection Inject 1,000 mcg into the muscle every 30 (thirty) days.    Marland Kitchen guaiFENesin (MUCINEX) 600 MG 12 hr tablet Take 2 tablets (1,200 mg total) by mouth 2 (two) times daily. 6 tablet 0  . isosorbide mononitrate (IMDUR) 60 MG 24 hr tablet Take 1 tablet (60 mg total) by mouth daily. 90 tablet 3  . nitroGLYCERIN (NITROSTAT) 0.4 MG SL tablet Place 1 tablet (0.4 mg total) under the tongue every 5 (five) minutes as needed for chest pain. 60 tablet 0  . omeprazole (PRILOSEC) 20 MG capsule Take 20 mg by mouth daily.    Marland Kitchen oxybutynin (DITROPAN) 5 MG tablet Take 5 mg by mouth 2 (two) times daily.    . pravastatin (PRAVACHOL) 40 MG tablet Take 40 mg by mouth at bedtime.     . predniSONE (DELTASONE) 1 MG tablet Take 2 mg by mouth every morning.  No current facility-administered medications for this visit.    Allergies:   Mercury ammoniated    Social History:  The patient  reports that he quit smoking about 41 years ago. He has never used smokeless tobacco. He reports that he drinks alcohol. He reports that he does not use illicit drugs.   Family History:  The patient's family history includes CAD in his father; Hypertension in his father and mother. There is no history of Heart attack or Stroke.    ROS:  Please see the history of present illness.   Otherwise, review of systems are positive for none.   All other systems are reviewed and negative.    PHYSICAL EXAM: VS:  BP 114/68 mmHg  Pulse 78  Wt 165 lb 6.4 oz (75.025 kg)  SpO2 98% , BMI Body mass index is 22.43 kg/(m^2). GEN: Well nourished, well developed, in no acute distress HEENT: normal Neck: no JVD, carotid  bruits, or masses Cardiac: RRR; no murmurs, rubs, or gallops,no edema  Respiratory:  clear to auscultation bilaterally, normal work of breathing GI: soft, nontender, nondistended, + BS MS: no deformity or atrophy Skin: warm and dry, no rash Neuro:  Strength and sensation are intact Psych: euthymic mood, full affect   EKG:  EKG is not ordered today. The ekg ordered today demonstrates    Recent Labs: 05/15/2014: TSH 2.02 06/19/2014: ALT 24 06/21/2014: BUN 13; Creatinine 0.72; Hemoglobin 12.1*; Platelets 221; Potassium 3.7; Sodium 138    Lipid Panel No results found for: CHOL, TRIG, HDL, CHOLHDL, VLDL, LDLCALC, LDLDIRECT    Wt Readings from Last 3 Encounters:  07/18/14 165 lb 6.4 oz (75.025 kg)  07/05/14 166 lb 3.2 oz (75.388 kg)  06/19/14 167 lb 11.2 oz (76.068 kg)      Other studies Reviewed: Additional studies/ records that were reviewed today include: . Review of the above records demonstrates:    ASSESSMENT AND PLAN:  1.  Coronary artery disease, quiesced 7. Recent chest discomfort was likely not coronary artery disease related.  2. Chronic dyspnea  3. Hyperlipidemia  Current medicines are reviewed at length with the patient today.  The patient does not have concerns regarding medicines.  The following changes have been made:  no change  Labs/ tests ordered today include:  No orders of the defined types were placed in this encounter.     Disposition:   FU with Linard Millers PRN   Signed, Sinclair Grooms, MD  07/18/2014 10:50 AM    [address], [place], [place]  [address] Phone: [phone]; Fax: [phone]

## 2015-08-07 ENCOUNTER — Observation Stay (HOSPITAL_COMMUNITY): Payer: Medicare Other

## 2015-08-07 ENCOUNTER — Inpatient Hospital Stay (HOSPITAL_COMMUNITY)
Admission: EM | Admit: 2015-08-07 | Discharge: 2015-08-14 | DRG: 689 | Disposition: A | Discharge status: Home / Self Care | Payer: Medicare Other | Attending: Internal Medicine | Admitting: Internal Medicine

## 2015-08-07 ENCOUNTER — Encounter (HOSPITAL_COMMUNITY): Payer: Self-pay

## 2015-08-07 DIAGNOSIS — M1990 Unspecified osteoarthritis, unspecified site: Secondary | ICD-10-CM | POA: Diagnosis present

## 2015-08-07 DIAGNOSIS — Z7982 Long term (current) use of aspirin: Secondary | ICD-10-CM | POA: Diagnosis not present

## 2015-08-07 DIAGNOSIS — C679 Malignant neoplasm of bladder, unspecified: Secondary | ICD-10-CM | POA: Diagnosis present

## 2015-08-07 DIAGNOSIS — R000 Tachycardia, unspecified: Secondary | ICD-10-CM

## 2015-08-07 DIAGNOSIS — J810 Acute pulmonary edema: Secondary | ICD-10-CM | POA: Diagnosis not present

## 2015-08-07 DIAGNOSIS — I959 Hypotension, unspecified: Secondary | ICD-10-CM

## 2015-08-07 DIAGNOSIS — Z91048 Other nonmedicinal substance allergy status: Secondary | ICD-10-CM | POA: Diagnosis not present

## 2015-08-07 DIAGNOSIS — R05 Cough: Secondary | ICD-10-CM | POA: Diagnosis present

## 2015-08-07 DIAGNOSIS — Z923 Personal history of irradiation: Secondary | ICD-10-CM

## 2015-08-07 DIAGNOSIS — E785 Hyperlipidemia, unspecified: Secondary | ICD-10-CM | POA: Diagnosis present

## 2015-08-07 DIAGNOSIS — M316 Other giant cell arteritis: Secondary | ICD-10-CM | POA: Diagnosis present

## 2015-08-07 DIAGNOSIS — J8410 Pulmonary fibrosis, unspecified: Secondary | ICD-10-CM | POA: Diagnosis present

## 2015-08-07 DIAGNOSIS — Z9181 History of falling: Secondary | ICD-10-CM | POA: Diagnosis not present

## 2015-08-07 DIAGNOSIS — Z8551 Personal history of malignant neoplasm of bladder: Secondary | ICD-10-CM

## 2015-08-07 DIAGNOSIS — R0902 Hypoxemia: Secondary | ICD-10-CM | POA: Diagnosis present

## 2015-08-07 DIAGNOSIS — I5031 Acute diastolic (congestive) heart failure: Secondary | ICD-10-CM | POA: Diagnosis present

## 2015-08-07 DIAGNOSIS — R2681 Unsteadiness on feet: Secondary | ICD-10-CM | POA: Diagnosis present

## 2015-08-07 DIAGNOSIS — W06XXXA Fall from bed, initial encounter: Secondary | ICD-10-CM | POA: Diagnosis present

## 2015-08-07 DIAGNOSIS — R059 Cough, unspecified: Secondary | ICD-10-CM | POA: Diagnosis present

## 2015-08-07 DIAGNOSIS — E860 Dehydration: Secondary | ICD-10-CM | POA: Diagnosis present

## 2015-08-07 DIAGNOSIS — F0390 Unspecified dementia without behavioral disturbance: Secondary | ICD-10-CM | POA: Diagnosis present

## 2015-08-07 DIAGNOSIS — Z7952 Long term (current) use of systemic steroids: Secondary | ICD-10-CM | POA: Diagnosis not present

## 2015-08-07 DIAGNOSIS — I2510 Atherosclerotic heart disease of native coronary artery without angina pectoris: Secondary | ICD-10-CM | POA: Diagnosis present

## 2015-08-07 DIAGNOSIS — Z87891 Personal history of nicotine dependence: Secondary | ICD-10-CM

## 2015-08-07 DIAGNOSIS — Z8546 Personal history of malignant neoplasm of prostate: Secondary | ICD-10-CM

## 2015-08-07 DIAGNOSIS — N390 Urinary tract infection, site not specified: Secondary | ICD-10-CM | POA: Diagnosis not present

## 2015-08-07 DIAGNOSIS — C675 Malignant neoplasm of bladder neck: Secondary | ICD-10-CM | POA: Diagnosis present

## 2015-08-07 DIAGNOSIS — B952 Enterococcus as the cause of diseases classified elsewhere: Secondary | ICD-10-CM | POA: Diagnosis present

## 2015-08-07 DIAGNOSIS — E876 Hypokalemia: Secondary | ICD-10-CM | POA: Diagnosis present

## 2015-08-07 DIAGNOSIS — K219 Gastro-esophageal reflux disease without esophagitis: Secondary | ICD-10-CM | POA: Diagnosis present

## 2015-08-07 DIAGNOSIS — W19XXXA Unspecified fall, initial encounter: Secondary | ICD-10-CM

## 2015-08-07 DIAGNOSIS — H5442 Blindness, left eye, normal vision right eye: Secondary | ICD-10-CM | POA: Diagnosis present

## 2015-08-07 DIAGNOSIS — C61 Malignant neoplasm of prostate: Secondary | ICD-10-CM | POA: Diagnosis present

## 2015-08-07 DIAGNOSIS — Z79899 Other long term (current) drug therapy: Secondary | ICD-10-CM | POA: Diagnosis not present

## 2015-08-07 DIAGNOSIS — Z9861 Coronary angioplasty status: Secondary | ICD-10-CM | POA: Diagnosis not present

## 2015-08-07 DIAGNOSIS — Z8249 Family history of ischemic heart disease and other diseases of the circulatory system: Secondary | ICD-10-CM | POA: Diagnosis not present

## 2015-08-07 DIAGNOSIS — W07XXXA Fall from chair, initial encounter: Secondary | ICD-10-CM | POA: Diagnosis present

## 2015-08-07 DIAGNOSIS — R531 Weakness: Secondary | ICD-10-CM | POA: Diagnosis not present

## 2015-08-07 DIAGNOSIS — R0600 Dyspnea, unspecified: Secondary | ICD-10-CM | POA: Diagnosis not present

## 2015-08-07 LAB — CBC
HEMATOCRIT: 45.3 % (ref 39.0–52.0)
HEMOGLOBIN: 14.8 g/dL (ref 13.0–17.0)
MCH: 33.3 pg (ref 26.0–34.0)
MCHC: 32.7 g/dL (ref 30.0–36.0)
MCV: 102 fL — ABNORMAL HIGH (ref 78.0–100.0)
PLATELETS: 179 10*3/uL (ref 150–400)
RBC: 4.44 MIL/uL (ref 4.22–5.81)
RDW: 14.3 % (ref 11.5–15.5)
WBC: 19.7 10*3/uL — AB (ref 4.0–10.5)

## 2015-08-07 LAB — URINE MICROSCOPIC-ADD ON

## 2015-08-07 LAB — BASIC METABOLIC PANEL
ANION GAP: 10 (ref 5–15)
BUN: 20 mg/dL (ref 6–20)
CO2: 23 mmol/L (ref 22–32)
CREATININE: 0.96 mg/dL (ref 0.61–1.24)
Calcium: 8.6 mg/dL — ABNORMAL LOW (ref 8.9–10.3)
Chloride: 106 mmol/L (ref 101–111)
GFR calc non Af Amer: >60 mL/min (ref >60)
Glucose, Bld: 145 mg/dL — ABNORMAL HIGH (ref 65–99)
POTASSIUM: 3.8 mmol/L (ref 3.5–5.1)
SODIUM: 139 mmol/L (ref 135–145)

## 2015-08-07 LAB — URINALYSIS, ROUTINE W REFLEX MICROSCOPIC
GLUCOSE, UA: NEGATIVE mg/dL
HGB URINE DIPSTICK: NEGATIVE
Ketones, ur: 15 mg/dL — AB
Nitrite: NEGATIVE
PH: 6 (ref 5.0–8.0)
PROTEIN: NEGATIVE mg/dL
SPECIFIC GRAVITY, URINE: 1.02 (ref 1.005–1.030)

## 2015-08-07 LAB — TROPONIN I
TROPONIN I: 0.03 ng/mL (ref <0.031)
Troponin I: 0.04 ng/mL — ABNORMAL HIGH (ref <0.031)

## 2015-08-07 LAB — INFLUENZA PANEL BY PCR (TYPE A & B)
H1N1FLUPCR: NOT DETECTED
INFLAPCR: NEGATIVE
INFLBPCR: NEGATIVE

## 2015-08-07 LAB — CK: CK TOTAL: 118 U/L (ref 49–397)

## 2015-08-07 LAB — CBG MONITORING, ED: Glucose-Capillary: 138 mg/dL — ABNORMAL HIGH (ref 65–99)

## 2015-08-07 LAB — I-STAT CG4 LACTIC ACID, ED: LACTIC ACID, VENOUS: 0.97 mmol/L (ref 0.5–2.0)

## 2015-08-07 MED ORDER — HYDROCORTISONE NA SUCCINATE PF 100 MG IJ SOLR
100.0000 mg | Freq: Once | INTRAMUSCULAR | Status: AC
  Administered 2015-08-07: 100 mg via INTRAVENOUS
  Filled 2015-08-07: qty 2

## 2015-08-07 MED ORDER — SODIUM CHLORIDE 0.9 % IV SOLN
INTRAVENOUS | Status: AC
  Administered 2015-08-07: 19:00:00 via INTRAVENOUS

## 2015-08-07 MED ORDER — HYDROCORTISONE NA SUCCINATE PF 100 MG IJ SOLR
50.0000 mg | Freq: Three times a day (TID) | INTRAMUSCULAR | Status: DC
  Administered 2015-08-07 – 2015-08-08 (×2): 50 mg via INTRAVENOUS
  Filled 2015-08-07 (×3): qty 2

## 2015-08-07 MED ORDER — STERILE WATER FOR INJECTION IJ SOLN
INTRAMUSCULAR | Status: AC
Start: 2015-08-07 — End: 2015-08-07
  Administered 2015-08-07: 10 mL
  Filled 2015-08-07: qty 10

## 2015-08-07 MED ORDER — ENOXAPARIN SODIUM 40 MG/0.4ML ~~LOC~~ SOLN
40.0000 mg | SUBCUTANEOUS | Status: DC
  Administered 2015-08-07 – 2015-08-13 (×7): 40 mg via SUBCUTANEOUS
  Filled 2015-08-07 (×7): qty 0.4

## 2015-08-07 MED ORDER — ALBUTEROL SULFATE (2.5 MG/3ML) 0.083% IN NEBU
2.5000 mg | INHALATION_SOLUTION | RESPIRATORY_TRACT | Status: DC | PRN
Start: 2015-08-07 — End: 2015-08-14

## 2015-08-07 MED ORDER — SODIUM CHLORIDE 0.9% FLUSH
3.0000 mL | Freq: Two times a day (BID) | INTRAVENOUS | Status: DC
  Administered 2015-08-07 – 2015-08-14 (×10): 3 mL via INTRAVENOUS

## 2015-08-07 MED ORDER — ACETAMINOPHEN 650 MG RE SUPP: 650.0000 mg | Freq: Four times a day (QID) | RECTAL | Status: DC | PRN

## 2015-08-07 MED ORDER — DEXTROSE 5 % IV SOLN
1.0000 g | INTRAVENOUS | Status: DC
  Administered 2015-08-08 – 2015-08-09 (×2): 1 g via INTRAVENOUS
  Filled 2015-08-07 (×3): qty 10

## 2015-08-07 MED ORDER — OXYBUTYNIN CHLORIDE 5 MG PO TABS
5.0000 mg | ORAL_TABLET | Freq: Two times a day (BID) | ORAL | Status: DC
  Administered 2015-08-08 – 2015-08-14 (×13): 5 mg via ORAL
  Filled 2015-08-07 (×14): qty 1

## 2015-08-07 MED ORDER — SODIUM CHLORIDE 0.9 % IV SOLN
INTRAVENOUS | Status: DC
  Administered 2015-08-07: 14:00:00 via INTRAVENOUS

## 2015-08-07 MED ORDER — CEFTRIAXONE SODIUM 1 G IJ SOLR
1.0000 g | Freq: Once | INTRAMUSCULAR | Status: AC
  Administered 2015-08-07: 1 g via INTRAMUSCULAR
  Filled 2015-08-07: qty 10

## 2015-08-07 MED ORDER — DOCUSATE SODIUM 100 MG PO CAPS
100.0000 mg | ORAL_CAPSULE | Freq: Two times a day (BID) | ORAL | Status: DC
  Administered 2015-08-08 – 2015-08-14 (×13): 100 mg via ORAL
  Filled 2015-08-07 (×14): qty 1

## 2015-08-07 MED ORDER — ASPIRIN EC 81 MG PO TBEC
81.0000 mg | DELAYED_RELEASE_TABLET | Freq: Every day | ORAL | Status: DC
  Administered 2015-08-08: 81 mg via ORAL
  Filled 2015-08-07 (×3): qty 1

## 2015-08-07 MED ORDER — SODIUM CHLORIDE 0.9 % IV BOLUS (SEPSIS)
500.0000 mL | Freq: Once | INTRAVENOUS | Status: AC
  Administered 2015-08-07: 500 mL via INTRAVENOUS

## 2015-08-07 MED ORDER — CEFTRIAXONE SODIUM 1 G IJ SOLR: 1.0000 g | INTRAMUSCULAR | Status: DC

## 2015-08-07 MED ORDER — ACETAMINOPHEN 325 MG PO TABS
650.0000 mg | ORAL_TABLET | Freq: Four times a day (QID) | ORAL | Status: DC | PRN
  Administered 2015-08-11 – 2015-08-12 (×3): 650 mg via ORAL
  Filled 2015-08-07 (×3): qty 2

## 2015-08-07 MED ORDER — SODIUM CHLORIDE 0.9 % IV BOLUS (SEPSIS)
1000.0000 mL | Freq: Once | INTRAVENOUS | Status: AC
  Administered 2015-08-07: 1000 mL via INTRAVENOUS

## 2015-08-07 MED ORDER — ONDANSETRON HCL 4 MG/2ML IJ SOLN
4.0000 mg | Freq: Four times a day (QID) | INTRAMUSCULAR | Status: DC | PRN
  Administered 2015-08-12: 4 mg via INTRAVENOUS
  Filled 2015-08-07: qty 2

## 2015-08-07 MED ORDER — ONDANSETRON HCL 4 MG PO TABS: 4.0000 mg | ORAL_TABLET | Freq: Four times a day (QID) | ORAL | Status: DC | PRN

## 2015-08-07 NOTE — ED Notes (Signed)
Spoke with Janett Billow on 4th floor, she reports patient can be transported to floor at 19:30.

## 2015-08-07 NOTE — ED Notes (Signed)
Informed patients family of assigned room.

## 2015-08-07 NOTE — ED Provider Notes (Signed)
CSN: [id number]     Arrival date & time 08/07/15  1153 History   First MD Initiated Contact with Patient 08/07/15 1303     Chief Complaint  Patient presents with  . Hypotension  . Weakness     (Consider location/radiation/quality/duration/timing/severity/associated sxs/prior Treatment) HPI   Derrick Andrews is a 82 y.o. male who presents for evaluation of weakness, lethargy, and fall from bed. Yesterday he was sitting in a chair, reached forward and slipped out of the chair onto the floor. After that, his daughter helped into his bed where he spent the night. This morning the aid, arrived and found that the patient was on the floor. The patient was alert at that time and did not complain of anything. The patient is unable to give history. His daughter who lives with him, and the aide, who found him this morning are here and giving history.  Level V caveat- dementia   Past Medical History  Diagnosis Date  . Hyperlipidemia   . Coronary artery disease ? 2010    a. hx LAD stent in 2003.  Marland Kitchen Cancer ([place])     a. 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  . Urinary frequency     AND SOMETIMES CAN'T TELL WHEN URINE IS COMING - DEPENDS AS NEEDED  . GERD (gastroesophageal reflux disease)   . Arthritis     IN MY BACK  . Hearing problem of both ears     WEARS BILATERAL HEARING AIDS  . Blind left eye     DIAGNOSED WITH ? TEMPORAL ARTERITIS - AND PUT ON PREDNISONE TO PREVENT BLINDNESS IN RIGHT EYE  . Unsteady gait     USES CANE WHEN AMBULATING  . Temporal arteritis ([place])     a. Biopsy proven per records.   Past Surgical History  Procedure Laterality Date  . Varicose veing stripping in 1950s    . Coronary angioplasty      HEART STENTING  . Tonsillectomy    . Transurethral resection of prostate N/A 01/19/2014    Procedure: TRANSURETHRAL RESECTION OF BLADDER CANCER INCASIVE INTO RIGHT PROSTATE (TURP) AND RESECTION OF SUPERFICIALBLADDER CANCER OF  TRIGONE AND BLADDER NECK;  Surgeon: Ailene Rud, MD;  Location: [facility];  Service: Urology;  Laterality: N/A;   Family History  Problem Relation Age of Onset  . CAD Father   . Heart attack Neg Hx   . Stroke Neg Hx   . Hypertension Mother   . Hypertension Father    Social History  Substance Use Topics  . Smoking status: Former Smoker    Quit date: 06/19/1973  . Smokeless tobacco: Never Used  . Alcohol Use: Yes     Comment: occasional    Review of Systems  Unable to perform ROS: Dementia      Allergies  Mercury ammoniated  Home Medications   Prior to Admission medications   Medication Sig Start Date End Date Taking? Authorizing Provider  acetaminophen (TYLENOL) 325 MG tablet Take 650 mg by mouth every 6 (six) hours as needed for moderate pain (arthritis pain).   Yes Historical Provider, MD  aspirin 81 MG tablet Take 81 mg by mouth daily.   Yes Historical Provider, MD  cyanocobalamin (,VITAMIN B-12,) 1000 MCG/ML injection Inject 1,000 mcg into the muscle every 30 (thirty) days.   Yes Historical Provider, MD  guaiFENesin (MUCINEX) 600 MG 12 hr tablet Take 2 tablets (1,200 mg total) by mouth 2 (two) times daily. 06/22/14  Yes Simbiso Ranga, MD  isosorbide mononitrate (IMDUR) 60 MG 24 hr tablet Take 1 tablet (60 mg total) by mouth daily. 05/15/14  Yes Liliane Shi, PA-C  nitroGLYCERIN (NITROSTAT) 0.4 MG SL tablet Place 1 tablet (0.4 mg total) under the tongue every 5 (five) minutes as needed for chest pain. 04/01/14  Yes Leonard Schwartz, MD  omeprazole (PRILOSEC) 20 MG capsule Take 20 mg by mouth daily.   Yes Historical Provider, MD  oxybutynin (DITROPAN) 5 MG tablet Take 5 mg by mouth 2 (two) times daily.   Yes Historical Provider, MD  pravastatin (PRAVACHOL) 40 MG tablet Take 40 mg by mouth at bedtime.    Yes Historical Provider, MD  predniSONE (DELTASONE) 1 MG tablet Take 2 mg by mouth every morning.    Yes Historical Provider, MD  Vitamin D, Ergocalciferol, (DRISDOL)  50000 units CAPS capsule Take 50,000 Units by mouth once a week. 06/07/15  Yes Historical Provider, MD   BP 106/88 mmHg  Pulse 86  Temp(Src) 99.6 F (37.6 C) (Rectal)  Resp 20  SpO2 95% Physical Exam  Constitutional: He appears well-developed.  Elderly, frail  HENT:  Head: Normocephalic and atraumatic.  Right Ear: External ear normal.  Left Ear: External ear normal.  Dry oral mucous membranes.  Eyes: Conjunctivae and EOM are normal. Pupils are equal, round, and reactive to light.  Neck: Normal range of motion and phonation normal. Neck supple.  Cardiovascular: Normal rate, regular rhythm and normal heart sounds.   Hypotension, on arrival  Pulmonary/Chest: Effort normal and breath sounds normal. He exhibits no bony tenderness.  Abdominal: Soft. He exhibits no distension. There is no tenderness. There is no guarding.  Musculoskeletal: Normal range of motion.  Neurological: He is alert. No cranial nerve deficit or sensory deficit. He exhibits normal muscle tone. Coordination normal.  Skin: Skin is warm, dry and intact.  Psychiatric: He has a normal mood and affect. His behavior is normal.  Nursing note and vitals reviewed.   ED Course  Procedures (including critical care time)  Medications  0.9 %  sodium chloride infusion ( Intravenous Stopped 08/07/15 1521)  sodium chloride 0.9 % bolus 500 mL (500 mLs Intravenous New Bag/Given 08/07/15 1530)  cefTRIAXone (ROCEPHIN) injection 1 g (not administered)  sodium chloride 0.9 % bolus 1,000 mL (0 mLs Intravenous Stopped 08/07/15 1346)    Patient Vitals for the past 24 hrs:  BP Temp Temp src Pulse Resp SpO2  08/07/15 1602 106/88 mmHg 99.6 F (37.6 C) Rectal 86 20 95 %  08/07/15 1500 (!) 81/51 mmHg - - 75 22 94 %  08/07/15 1415 (!) 95/44 mmHg - - 78 18 94 %  08/07/15 1345 94/56 mmHg - - 77 18 91 %  08/07/15 1315 107/64 mmHg - - 84 22 91 %  08/07/15 1300 97/57 mmHg - - 84 22 94 %  08/07/15 1159 (!) 71/51 mmHg 97.5 F (36.4 C) Oral 89  13 96 %    4:02 PM Reevaluation with update and discussion. After initial assessment and treatment, an updated evaluation reveals no change in clinical status. Blood pressure has been labile here, but generally improves with intravenous fluids. I discussed with patient, and daughter, all questions answered. Gitty Osterlund L   4:14 PM-Consult complete with Hospitalist. Patient case explained and discussed. He agrees to admit patient for further evaluation and treatment, and requested that I order a single lactate. Call ended at 16:20  [place] - Abnormal; Notable for the  following:    Glucose, Bld 145 (*)    Calcium 8.6 (*)    All other components within normal limits  CBC - Abnormal; Notable for the following:    WBC 19.7 (*)    MCV 102.0 (*)    All other components within normal limits  TROPONIN I - Abnormal; Notable for the following:    Troponin I 0.04 (*)    All other components within normal limits  CBG MONITORING, ED - Abnormal; Notable for the following:    Glucose-Capillary 138 (*)    All other components within normal limits  URINALYSIS, ROUTINE W REFLEX MICROSCOPIC (NOT AT [facility])    Imaging Review No results found. I have personally reviewed and evaluated these images and lab results as part of my medical decision-making.   EKG Interpretation   Date/Time:  Tuesday August 07 2015 13:37:09 EST Ventricular Rate:  82 PR Interval:  206 QRS Duration: 94 QT Interval:  384 QTC Calculation: 448 R Axis:   30 Text Interpretation:  Sinus rhythm Borderline T wave abnormalities Since  last tracing of earlier today no significantST elevation is present  Confirmed by Eulis Foster  MD, Axtyn Woehler [phone]) on 08/07/2015 3:38:42 PM      MDM   Final diagnoses:  Urinary tract infection without hematuria, site unspecified  Dehydration  Fall, initial encounter    Fall, without serious injury. Dehydration associated with urinary tract infection.  Mild hypotension improves with intravenous fluids. Doubt sepsis, metabolic instability or impending vascular collapse. He will require admission to the hospital for observation and further treatment.  Nursing Notes Reviewed/ Care Coordinated Applicable Imaging Reviewed Interpretation of Laboratory Data incorporated into ED treatment  Plan: Admit    Daleen Bo, MD 08/07/15 [phone]

## 2015-08-07 NOTE — ED Notes (Signed)
Pt can go up at 18:45

## 2015-08-07 NOTE — H&P (Signed)
[facility] History and Physical  Derrick Andrews QPR:[id number] DOB: Nov 05, 1917 DOA: 08/07/2015   PCP: Derrick Finer, MD  Specialists: Patient is followed by urology, cardiology. He goes to the [place] for his hearing impairment.  Chief Complaint: Weakness  HPI: Derrick Andrews is a 83 y.o. male with a past medical history of coronary artery disease, bladder cancer, temporal arteritis on chronic steroids, who lives with his daughter and was brought in today due to 2 falls and generalized weakness ongoing for the last 2 days. Most of the history was provided by the patient's daughter. Apparently on Saturday patient wasn't feeling quite well. He didn't do his usual activities. On Sunday, however, he went to church. He went to see his girlfriend. He seemed to be doing all right. On Monday his daughter had to go to work and when she came home she found him sleeping on the sofa. Apparently he had slept all day, which is very unusual for him. And then later in the day, he was sitting on the chair when he fell out of the chair. The daughter had to call the neighbors to assist him get in the bed. This morning patient was found by one of his caregivers on the floor. He called the neighbors to get him back in the bed. They took him to his primary care physician's office where he was found to have low blood pressure and so he was referred to the emergency department. There's been no nausea, vomiting or diarrhea. He has had a slight cough but no fever, no chills. No sick contacts. Denies any real discomfort with urination, although he does have prostate problems as a result of which he has frequency and hesitation at times.  In the emergency department, patient was noted to have a low blood pressure. His UA was found to be abnormal. He was started having urinary tract infection. He was given fluids and his blood pressure responded appropriately.   Home Medications: Prior to Admission medications     Medication Sig Start Date End Date Taking? Authorizing Provider  acetaminophen (TYLENOL) 325 MG tablet Take 650 mg by mouth every 6 (six) hours as needed for moderate pain (arthritis pain).   Yes Historical Provider, MD  aspirin 81 MG tablet Take 81 mg by mouth daily.   Yes Historical Provider, MD  cyanocobalamin (,VITAMIN B-12,) 1000 MCG/ML injection Inject 1,000 mcg into the muscle every 30 (thirty) days.   Yes Historical Provider, MD  guaiFENesin (MUCINEX) 600 MG 12 hr tablet Take 2 tablets (1,200 mg total) by mouth 2 (two) times daily. 06/22/14  Yes Simbiso Ranga, MD  isosorbide mononitrate (IMDUR) 60 MG 24 hr tablet Take 1 tablet (60 mg total) by mouth daily. 05/15/14  Yes Liliane Shi, PA-C  nitroGLYCERIN (NITROSTAT) 0.4 MG SL tablet Place 1 tablet (0.4 mg total) under the tongue every 5 (five) minutes as needed for chest pain. 04/01/14  Yes Leonard Schwartz, MD  omeprazole (PRILOSEC) 20 MG capsule Take 20 mg by mouth daily.   Yes Historical Provider, MD  oxybutynin (DITROPAN) 5 MG tablet Take 5 mg by mouth 2 (two) times daily.   Yes Historical Provider, MD  pravastatin (PRAVACHOL) 40 MG tablet Take 40 mg by mouth at bedtime.    Yes Historical Provider, MD  predniSONE (DELTASONE) 1 MG tablet Take 2 mg by mouth every morning.    Yes Historical Provider, MD  Vitamin D, Ergocalciferol, (DRISDOL) 50000 units CAPS capsule Take 50,000 Units by mouth once a  week. 06/07/15  Yes Historical Provider, MD    Allergies:  Allergies  Allergen Reactions  . Mercury Ammoniated [Ammoniated Mercury] Other (See Comments)    Blisters    Past Medical History: Past Medical History  Diagnosis Date  . Hyperlipidemia   . Coronary artery disease ? 2010    a. hx LAD stent in 2003.  Marland Kitchen Cancer ([place])     a. 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  . Urinary frequency     AND SOMETIMES CAN'T TELL WHEN URINE IS COMING - DEPENDS AS NEEDED  . GERD (gastroesophageal  reflux disease)   . Arthritis     IN MY BACK  . Hearing problem of both ears     WEARS BILATERAL HEARING AIDS  . Blind left eye     DIAGNOSED WITH ? TEMPORAL ARTERITIS - AND PUT ON PREDNISONE TO PREVENT BLINDNESS IN RIGHT EYE  . Unsteady gait     USES CANE WHEN AMBULATING  . Temporal arteritis ([place])     a. Biopsy proven per records.    Past Surgical History  Procedure Laterality Date  . Varicose veing stripping in 1950s    . Coronary angioplasty      HEART STENTING  . Tonsillectomy    . Transurethral resection of prostate N/A 01/19/2014    Procedure: TRANSURETHRAL RESECTION OF BLADDER CANCER INCASIVE INTO RIGHT PROSTATE (TURP) AND RESECTION OF SUPERFICIALBLADDER CANCER OF TRIGONE AND BLADDER NECK;  Surgeon: Ailene Rud, MD;  Location: [facility];  Service: Urology;  Laterality: N/A;    Social History: Patient lives with his daughter. He quit smoking many years ago. No alcohol use. Usually uses a cane to ambulate. But does have a walker as well.  Family History:  Family History  Problem Relation Age of Onset  . CAD Father   . Heart attack Neg Hx   . Stroke Neg Hx   . Hypertension Mother   . Hypertension Father      Review of Systems - difficult to obtain on this patient who has significant hearing impairment  Physical Examination  Filed Vitals:   08/07/15 1630 08/07/15 1645 08/07/15 1700 08/07/15 1730  BP: 101/55 101/55 113/61 99/63  Pulse: 79 83 92 78  Temp:      TempSrc:      Resp: 19 15 20 18   SpO2: 95% 93% 97% 93%    BP 99/63 mmHg  Pulse 78  Temp(Src) 99.6 F (37.6 C) (Rectal)  Resp 18  SpO2 93%  General appearance: alert, cooperative, distracted and no distress Head: Normocephalic, without obvious abnormality, atraumatic Eyes: conjunctivae/corneas clear. PERRL, EOM's intact.  Throat: lips, mucosa, and tongue normal; teeth and gums normal Neck: no adenopathy, no carotid bruit, no JVD, supple, symmetrical, trachea midline and thyroid not enlarged,  symmetric, no tenderness/mass/nodules Resp: clear to auscultation bilaterally Cardio: regular rate and rhythm, S1, S2 normal, no murmur, click, rub or gallop GI: soft, non-tender; bowel sounds normal; no masses,  no organomegaly Extremities: extremities normal, atraumatic, no cyanosis or edema Pulses: 2+ and symmetric Skin: Skin color, texture, turgor normal. No rashes or lesions Lymph nodes: Cervical, supraclavicular, and axillary nodes normal. Neurologic: He is awake, alert. Slightly confused. Oriented to person, place. No facial asymmetry. Motor strength equal bilateral upper and lower extremities. Gait not assessed.  Laboratory Data: Results for orders placed or performed during the hospital encounter of 08/07/15 (from the past 48 hour(s))  Basic metabolic panel     Status:  Abnormal   Collection Time: 08/07/15 12:27 PM  Result Value Ref Range   Sodium 139 135 - 145 mmol/L   Potassium 3.8 3.5 - 5.1 mmol/L   Chloride 106 101 - 111 mmol/L   CO2 23 22 - 32 mmol/L   Glucose, Bld 145 (H) 65 - 99 mg/dL   BUN 20 6 - 20 mg/dL   Creatinine, Ser 0.96 0.61 - 1.24 mg/dL   Calcium 8.6 (L) 8.9 - 10.3 mg/dL   GFR calc non Af Amer >60 >60 mL/min   GFR calc Af Amer >60 >60 mL/min    Comment: (NOTE) The eGFR has been calculated using the CKD EPI equation. This calculation has not been validated in all clinical situations. eGFR's persistently <60 mL/min signify possible Chronic Kidney Disease.    Anion gap 10 5 - 15  CBC     Status: Abnormal   Collection Time: 08/07/15 12:27 PM  Result Value Ref Range   WBC 19.7 (H) 4.0 - 10.5 K/uL   RBC 4.44 4.22 - 5.81 MIL/uL   Hemoglobin 14.8 13.0 - 17.0 g/dL   HCT 45.3 39.0 - 52.0 %   MCV 102.0 (H) 78.0 - 100.0 fL   MCH 33.3 26.0 - 34.0 pg   MCHC 32.7 30.0 - 36.0 g/dL   RDW 14.3 11.5 - 15.5 %   Platelets 179 150 - 400 K/uL  Troponin I     Status: Abnormal   Collection Time: 08/07/15 12:27 PM  Result Value Ref Range   Troponin I 0.04 (H) <0.031  ng/mL    Comment:        PERSISTENTLY INCREASED TROPONIN VALUES IN THE RANGE OF 0.04-0.49 ng/mL CAN BE SEEN IN:       -UNSTABLE ANGINA       -CONGESTIVE HEART FAILURE       -MYOCARDITIS       -CHEST TRAUMA       -ARRYHTHMIAS       -LATE PRESENTING MYOCARDIAL INFARCTION       -COPD   CLINICAL FOLLOW-UP RECOMMENDED.   CBG monitoring, ED     Status: Abnormal   Collection Time: 08/07/15  1:11 PM  Result Value Ref Range   Glucose-Capillary 138 (H) 65 - 99 mg/dL  Urinalysis, Routine w reflex microscopic (not at [facility])     Status: Abnormal   Collection Time: 08/07/15  3:55 PM  Result Value Ref Range   Color, Urine AMBER (A) YELLOW    Comment: BIOCHEMICALS MAY BE AFFECTED BY COLOR   APPearance CLOUDY (A) CLEAR   Specific Gravity, Urine 1.020 1.005 - 1.030   pH 6.0 5.0 - 8.0   Glucose, UA NEGATIVE NEGATIVE mg/dL   Hgb urine dipstick NEGATIVE NEGATIVE   Bilirubin Urine SMALL (A) NEGATIVE   Ketones, ur 15 (A) NEGATIVE mg/dL   Protein, ur NEGATIVE NEGATIVE mg/dL   Nitrite NEGATIVE NEGATIVE   Leukocytes, UA MODERATE (A) NEGATIVE  Urine microscopic-add on     Status: Abnormal   Collection Time: 08/07/15  3:55 PM  Result Value Ref Range   Squamous Epithelial / LPF 0-5 (A) NONE SEEN   WBC, UA 6-30 0 - 5 WBC/hpf   RBC / HPF 0-5 0 - 5 RBC/hpf   Bacteria, UA RARE (A) NONE SEEN    Radiology Reports: Dg Chest Port 1 View  08/07/2015  CLINICAL DATA:  Lethargy; fall from bed 1 day prior. Chronic cough. History of prostate carcinoma EXAM: PORTABLE CHEST 1 VIEW COMPARISON:  August 02, 2014  FINDINGS: There is widespread pulmonary fibrotic type change, stable. No frank edema or consolidation. Heart is upper normal in size with pulmonary vascularity within normal limits. No adenopathy. No blastic or lytic bone lesions are apparent. A prior lower thoracic vertebral body fracture is better seen on prior study with lateral view included. IMPRESSION: Widespread pulmonary fibrotic type change, stable. No  frank edema or consolidation. No change in cardiac silhouette. Electronically Signed   By: Lowella Grip III M.D.   On: 08/07/2015 17:36    My interpretation of Electrocardiogram: Sinus rhythm, normal axis. First degree AV block. No concerning ST or T-wave changes.  Problem List  Principal Problem:   UTI (urinary tract infection) Active Problems:   Cancer, bladder, neck (HCC)   Temporal arteritis (HCC)   Coronary artery disease   Cough   UTI (lower urinary tract infection)   Assessment: This is a 82 year old Caucasian male with a past medical history as stated earlier, who presents with a three-day history of worsening generalized weakness and is noted to have leukocytosis along with abnormal, UA. He appears to have an UTI. Chest x-ray shows a chronic pulmonary fibrosis-type changes. Lactic acid level is pending.   Plan: #1 Urinary tract infection: Most likely explanation for his presentation. Low blood pressure could have been due to dehydration and has responded to IV fluids. Heart rate was never elevated. Lactic acid level is pending. No clear evidence for sepsis. Will be placed on ceftriaxone. Follow-up on urine cultures.  #2 Hypotension: Possibly due to dehydration. Lactic acid level is pending. Blood pressure has responded appropriately to fluids. His mentation is normal. He is noted to be on chronic steroids, so adrenal insufficiency is a possibility. We will give him stress dose hydrocortisone for now.  #3 Cough: Chest x-ray shows chronic changes. No acute infiltrate noted. Influenza PCR will be ordered.  #4 history of temporal arteritis: He is chronically on steroids. We are utilizing stress dosing for now. This can be tapered down depending on clinical status.  #5 history of coronary artery disease: Troponin noted to be mildly elevated. EKG does not show any ischemic changes. He is not experiencing any chest pain. Troponins will be repeated.  #6 falls at home: Most likely  secondary to UTI. Check CK level. PT and OT will be consulted.  DVT Prophylaxis: Lovenox Code Status: Discussed in detail with the patient and daughter. He would like to be full Code Family Communication: Discussed with the patient and his daughter  Disposition Plan: Admit to telemetry   Further management decisions will depend on results of further testing and patient's response to treatment.   [facility]  [facility] Pager [phone]  If 7PM-7AM, please contact night-coverage [email or web address] Password [facility]  08/07/2015, 5:43 PM

## 2015-08-07 NOTE — ED Notes (Signed)
Pt presents with c/o hypotension and weakness. Pt's daughter reports that he did not eat as much on Sunday for dinner as usual and then yesterday starting feeling worse with increasing weakness. Pt has had 2 falls at least in the last 24 hours. Doctor's office advised that pt needed some fluid. Pt denies any pain at this time. Caregiver reports he was on the floor when he arrived at the house this morning.

## 2015-08-07 NOTE — Progress Notes (Signed)
EDCM spoke to patient and his daughter at bedside.  Patient's daughter Derrick Andrews [phone].  Patient lives at home with his daughter.  Patient's daughter works during the day.  She reports they have a gentlemen who comes out to the house from 8am-3pm and takes patient out to run errands etc.  Patient's daughter reports patient is usually quite active.  Patient stays on one level of the home.  Patient has had home health services in the past but cannot remember what agency it was.  Patient has a walker, cane, wheelchair, and an elevated toilet seat at home.  Patient has a new pcp located at [facility] but they cannot remember her name.  EDCM provided patient's family with list of home health agencies in [facility], explained services.  Patient is hard of hearing.  All questions answered by patient's family.  Family thankful for services.  No further EDCM needs at this time.

## 2015-08-08 DIAGNOSIS — N390 Urinary tract infection, site not specified: Principal | ICD-10-CM

## 2015-08-08 DIAGNOSIS — I2510 Atherosclerotic heart disease of native coronary artery without angina pectoris: Secondary | ICD-10-CM

## 2015-08-08 DIAGNOSIS — M316 Other giant cell arteritis: Secondary | ICD-10-CM

## 2015-08-08 LAB — COMPREHENSIVE METABOLIC PANEL
ALK PHOS: 40 U/L (ref 38–126)
ALT: 12 U/L — AB (ref 17–63)
AST: 24 U/L (ref 15–41)
Albumin: 2.8 g/dL — ABNORMAL LOW (ref 3.5–5.0)
Anion gap: 6 (ref 5–15)
BILIRUBIN TOTAL: 0.9 mg/dL (ref 0.3–1.2)
BUN: 24 mg/dL — AB (ref 6–20)
CHLORIDE: 108 mmol/L (ref 101–111)
CO2: 23 mmol/L (ref 22–32)
CREATININE: 0.96 mg/dL (ref 0.61–1.24)
Calcium: 8 mg/dL — ABNORMAL LOW (ref 8.9–10.3)
GFR calc Af Amer: >60 mL/min (ref >60)
Glucose, Bld: 156 mg/dL — ABNORMAL HIGH (ref 65–99)
Potassium: 4.1 mmol/L (ref 3.5–5.1)
Sodium: 137 mmol/L (ref 135–145)
TOTAL PROTEIN: 5.5 g/dL — AB (ref 6.5–8.1)

## 2015-08-08 LAB — CBC
HCT: 37 % — ABNORMAL LOW (ref 39.0–52.0)
Hemoglobin: 12.2 g/dL — ABNORMAL LOW (ref 13.0–17.0)
MCH: 33.4 pg (ref 26.0–34.0)
MCHC: 33 g/dL (ref 30.0–36.0)
MCV: 101.4 fL — AB (ref 78.0–100.0)
PLATELETS: 144 10*3/uL — AB (ref 150–400)
RBC: 3.65 MIL/uL — ABNORMAL LOW (ref 4.22–5.81)
RDW: 14.4 % (ref 11.5–15.5)
WBC: 12.1 10*3/uL — AB (ref 4.0–10.5)

## 2015-08-08 LAB — TROPONIN I
Troponin I: <0.03 ng/mL (ref <0.031)
Troponin I: <0.03 ng/mL (ref <0.031)

## 2015-08-08 MED ORDER — LORAZEPAM 2 MG/ML IJ SOLN
0.5000 mg | Freq: Once | INTRAMUSCULAR | Status: AC
  Administered 2015-08-09: 0.5 mg via INTRAVENOUS
  Filled 2015-08-08: qty 1

## 2015-08-08 MED ORDER — HYDROCORTISONE NA SUCCINATE PF 100 MG IJ SOLR
50.0000 mg | Freq: Two times a day (BID) | INTRAMUSCULAR | Status: DC
  Administered 2015-08-08 – 2015-08-09 (×2): 50 mg via INTRAVENOUS
  Filled 2015-08-08 (×2): qty 2

## 2015-08-08 NOTE — Evaluation (Signed)
Occupational Therapy Evaluation Patient Details Name: Derrick Andrews MRN: [id number] DOB: 01-05-1918 Today's Date: 08/08/2015    History of Present Illness pt was admitted for weakness:  recent falls.  H/O CAD and bladder CA   Clinical Impression   This 82 year old man was admitted for the above.  He was independent with adls prior to admission.  Pt has had recent falls.  Pt will benefit from continued OT.  Goals in acute are for min guard to supervision level.  He currently needs min A at this time    Follow Up Recommendations  Supervision/Assistance - 24 hour;SNF (HHOT if home)    Equipment Recommendations  None recommended by OT (has elevated toilet seat with rails)    Recommendations for Other Services       Precautions / Restrictions Precautions Precautions: Fall Restrictions Weight Bearing Restrictions: No      Mobility Bed Mobility Overal bed mobility: Modified Independent             General bed mobility comments: HOB raised; extra time  Transfers Overall transfer level: Needs assistance Equipment used: 1 person hand held assist Transfers: Sit to/from [facility] Sit to Stand: Min assist Stand pivot transfers: Min assist       General transfer comment: steadying assistance to rise; hand held assist to transfer to chair.  Pt did not want to use RW    Balance Overall balance assessment: History of Falls (fell reaching from chair--cushion slipped; 2x OOB)                                          ADL Overall ADL's : Needs assistance/impaired                         Toilet Transfer: Minimal assistance;Stand-pivot (chair)             General ADL Comments: pt needs min A for sit to stand for LB adls; set up for UB.  Transferred to chair but did not want to stay up     Vision     Perception     Praxis      Pertinent Vitals/Pain Pain Assessment: Faces Faces Pain Scale: Hurts little more Pain  Location: bil knees Pain Descriptors / Indicators: Aching Pain Intervention(s): Limited activity within patient's tolerance;Monitored during session;Repositioned     Hand Dominance     Extremity/Trunk Assessment Upper Extremity Assessment Upper Extremity Assessment: Overall WFL for tasks assessed (grossly 4/5 strength)       Cervical / Trunk Assessment Cervical / Trunk Assessment: Kyphotic   Communication Communication Communication: HOH   Cognition Arousal/Alertness: Awake/alert Behavior During Therapy: WFL for tasks assessed/performed Overall Cognitive Status: Within Functional Limits for tasks assessed                     General Comments       Exercises       Shoulder Instructions      Home Living Family/patient expects to be discharged to:: Private residence Living Arrangements: Children                               Additional Comments: daughter works Medical laboratory scientific officer, Pharmacist, hospital.  Has aide 3 days week--pt alone 2 hours those days, alone all day on M and  F      Prior Functioning/Environment Level of Independence: Independent;Independent with assistive device(s)        Comments: has quad cane and RW but doesn't use either insdie    OT Diagnosis: Generalized weakness   OT Problem List: Decreased strength;Decreased activity tolerance;Impaired balance (sitting and/or standing);Pain   OT Treatment/Interventions: Self-care/ADL training;DME and/or AE instruction;Balance training;Patient/family education    OT Goals(Current goals can be found in the care plan section) Acute Rehab OT Goals Patient Stated Goal: home OT Goal Formulation: With patient Time For Goal Achievement: 08/15/15 Potential to Achieve Goals: Good ADL Goals Pt Will Perform Lower Body Bathing: with supervision;sit to/from stand Pt Will Perform Lower Body Dressing: with supervision;sit to/from stand Pt Will Transfer to Toilet: with min guard assist;ambulating;bedside commode Pt Will  Perform Toileting - Clothing Manipulation and hygiene: with supervision;sit to/from stand  OT Frequency: Min 2X/week   Barriers to D/C:            Co-evaluation              End of Session    Activity Tolerance: Patient tolerated treatment well Patient left: in bed;with call bell/phone within reach;with bed alarm set   Time: 1131-1150 OT Time Calculation (min): 19 min Charges:  OT General Charges $OT Visit: 1 Procedure OT Evaluation $OT Eval Low Complexity: 1 Procedure G-Codes:    Lonny Eisen Sep 05, 2015, 12:13 PM  Lesle Chris, OTR/L [phone] 09-05-15

## 2015-08-08 NOTE — Progress Notes (Signed)
Patient Demographics  Derrick Andrews, is a 82 y.o. male, DOB - 1917/10/25, [id number]  Admit date - 08/07/2015   Admitting Physician Bonnielee Haff, MD  Outpatient Primary MD for the patient is Horton Finer, MD  LOS - 1   Chief Complaint  Patient presents with  . Hypotension  . Weakness         Subjective:   Derrick Andrews today denies any chest pain, shortness of breath, cough or fever . Assessment & Plan    Principal Problem:   UTI (urinary tract infection) Active Problems:   Cancer, bladder, neck (HCC)   Temporal arteritis (HCC)   Coronary artery disease   Cough   UTI (lower urinary tract infection)  UTI  - Continue with IV Rocephin, follow on urine cultures   Hypotension - Dehydration and volume depletion, blood pressure is improving, continue gentle hydration, continue to hold Imdur/ - We'll continue with this dose steroid taper  History of temporal arteritis - On chronic steroids, taper steroids down to his home dose  History of coronary artery disease - Denies any chest pain or shortness of breath - Troponins negative 3, EKG nonacute - Continue with aspirin  Hyperlipidemia  - Continue with statin   Code Status: Full code  Family Communication: Caregiver at bedside   Disposition Plan: Pending PT evaluation   Procedures  None   Consults  None   Medications  Scheduled Meds: . aspirin EC  81 mg Oral Daily  . cefTRIAXone (ROCEPHIN)  IV  1 g Intravenous Q24H  . docusate sodium  100 mg Oral BID  . enoxaparin (LOVENOX) injection  40 mg Subcutaneous Q24H  . hydrocortisone sod succinate (SOLU-CORTEF) inj  50 mg Intravenous Q8H  . oxybutynin  5 mg Oral BID  . sodium chloride flush  3 mL Intravenous Q12H   Continuous Infusions: . sodium chloride 75 mL/hr at 08/07/15 1850   PRN Meds:.acetaminophen **OR** acetaminophen, albuterol, ondansetron **OR**  ondansetron (ZOFRAN) IV  DVT Prophylaxis  Lovenox   Lab Results  Component Value Date   PLT 144* 08/08/2015    Antibiotics    Anti-infectives    Start     Dose/Rate Route Frequency Ordered Stop   08/08/15 1400  cefTRIAXone (ROCEPHIN) injection 1 g  Status:  Discontinued     1 g Intramuscular Every 24 hours 08/07/15 2001 08/07/15 2008   08/08/15 1400  cefTRIAXone (ROCEPHIN) 1 g in dextrose 5 % 50 mL IVPB     1 g 100 mL/hr over 30 Minutes Intravenous Every 24 hours 08/07/15 2008     08/07/15 1615  cefTRIAXone (ROCEPHIN) injection 1 g     1 g Intramuscular  Once 08/07/15 1604 08/07/15 1625          Objective:   Filed Vitals:   08/07/15 1927 08/07/15 1953 08/08/15 0511 08/08/15 1323  BP: 117/54 99/42 118/41 137/53  Pulse: 77 85 51 80  Temp: 98.3 F (36.8 C) 98.4 F (36.9 C) 97.6 F (36.4 C)   TempSrc: Oral Oral Oral   Resp: 20 20 18 20   Height:  6\' 3"  (1.905 m)    Weight:  80.1 kg (176 lb 9.4 oz)    SpO2: 95% 96% 96% 97%    Wt Readings  from Last 3 Encounters:  08/07/15 80.1 kg (176 lb 9.4 oz)  07/18/14 75.025 kg (165 lb 6.4 oz)  07/05/14 75.388 kg (166 lb 3.2 oz)     Intake/Output Summary (Last 24 hours) at 08/08/15 1510 Last data filed at 08/08/15 1300  Gross per 24 hour  Intake 1392.5 ml  Output    350 ml  Net 1042.5 ml     Physical Exam  Awake Alert Supple Neck,No JVD,   Symmetrical Chest wall movement, Good air movement bilaterally, CTAB RRR,No Gallops,Rubs or new Murmurs, No Parasternal Heave +ve B.Sounds, Abd Soft, No tenderness, No organomegaly appriciated, No rebound - guarding or rigidity. No Cyanosis, Clubbing or edema, No new Rash or bruise     Data Review   Micro Results Recent Results (from the past 240 hour(s))  Culture, Urine     Status: None (Preliminary result)   Collection Time: 08/07/15  3:55 PM  Result Value Ref Range Status   Specimen Description URINE, CLEAN CATCH  Final   Special Requests NONE  Final   Culture   Final     TOO YOUNG TO READ Performed at [facility]    Report Status PENDING  Incomplete    Radiology Reports Dg Chest Port 1 View  08/07/2015  CLINICAL DATA:  Lethargy; fall from bed 1 day prior. Chronic cough. History of prostate carcinoma EXAM: PORTABLE CHEST 1 VIEW COMPARISON:  August 02, 2014 FINDINGS: There is widespread pulmonary fibrotic type change, stable. No frank edema or consolidation. Heart is upper normal in size with pulmonary vascularity within normal limits. No adenopathy. No blastic or lytic bone lesions are apparent. A prior lower thoracic vertebral body fracture is better seen on prior study with lateral view included. IMPRESSION: Widespread pulmonary fibrotic type change, stable. No frank edema or consolidation. No change in cardiac silhouette. Electronically Signed   By: Lowella Grip III M.D.   On: 08/07/2015 17:36     CBC  Recent Labs Lab 08/07/15 1227 08/08/15 0215  WBC 19.7* 12.1*  HGB 14.8 12.2*  HCT 45.3 37.0*  PLT 179 144*  MCV 102.0* 101.4*  MCH 33.3 33.4  MCHC 32.7 33.0  RDW 14.3 14.4    Chemistries   Recent Labs Lab 08/07/15 1227 08/08/15 0215  NA 139 137  K 3.8 4.1  CL 106 108  CO2 23 23  GLUCOSE 145* 156*  BUN 20 24*  CREATININE 0.96 0.96  CALCIUM 8.6* 8.0*  AST  --  24  ALT  --  12*  ALKPHOS  --  40  BILITOT  --  0.9   ------------------------------------------------------------------------------------------------------------------ estimated creatinine clearance is 52.1 mL/min (by C-G formula based on Cr of 0.96). ------------------------------------------------------------------------------------------------------------------ No results for input(s): HGBA1C in the last 72 hours. ------------------------------------------------------------------------------------------------------------------ No results for input(s): CHOL, HDL, LDLCALC, TRIG, CHOLHDL, LDLDIRECT in the last 72  hours. ------------------------------------------------------------------------------------------------------------------ No results for input(s): TSH, T4TOTAL, T3FREE, THYROIDAB in the last 72 hours.  Invalid input(s): FREET3 ------------------------------------------------------------------------------------------------------------------ No results for input(s): VITAMINB12, FOLATE, FERRITIN, TIBC, IRON, RETICCTPCT in the last 72 hours.  Coagulation profile No results for input(s): INR, PROTIME in the last 168 hours.  No results for input(s): DDIMER in the last 72 hours.  Cardiac Enzymes  Recent Labs Lab 08/07/15 2050 08/08/15 0215 08/08/15 0750  TROPONINI 0.03 <0.03 <0.03   ------------------------------------------------------------------------------------------------------------------ Invalid input(s): POCBNP     Time Spent in minutes   25 minutes   Andre Swander M.D on 08/08/2015 at 3:10 PM  Between 7am to 7pm -  Pager - [phone]  After 7pm go to [email or web address] - password [email or web address]  [facility]   Office  [phone]

## 2015-08-08 NOTE — Evaluation (Signed)
Physical Therapy Evaluation Patient Details Name: Derrick Andrews MRN: [id number] DOB: Jan 21, 1918 Today's Date: 08/08/2015   History of Present Illness  82 yo male  admitted for weakness, recent falls.  H/O CAD and bladder CA  Clinical Impression  Pt admitted with above diagnosis. Pt currently with functional limitations due to the deficits listed below (see PT Problem List).  Pt will benefit from skilled PT to increase their independence and safety with mobility to allow discharge to the venue listed below.  Pt would benefit from incr supervision/caregiver support at home vs STSNF; will follow        Follow Up Recommendations Home health PT;SNF (HHPT/incr supervision at home vs SNF)    Equipment Recommendations  None recommended by PT    Recommendations for Other Services       Precautions / Restrictions Precautions Precautions: Fall Restrictions Weight Bearing Restrictions: No      Mobility  Bed Mobility Overal bed mobility: Modified Independent             General bed mobility comments: HOB raised; extra time  Transfers Overall transfer level: Needs assistance Equipment used: None Transfers: Sit to/from Stand Sit to Stand: Min assist;Min guard Stand pivot transfers: Min assist       General transfer comment: steadying assistance to rise, incr time  Ambulation/Gait Ambulation/Gait assistance: Min assist;Min guard Ambulation Distance (Feet): 75 Feet Assistive device: Rolling walker (2 wheeled) Gait Pattern/deviations: Step-through pattern;Trunk flexed     General Gait Details: verbal cues for RW distance from self; pt tends to walk too far away from walker and is unsafe with turns; he self cues occasionally but doesn't always correct  Stairs            Wheelchair Mobility    Modified Rankin (Stroke Patients Only)       Balance Overall balance assessment: History of Falls (fell from chair,  has fallen OOB x2)                                            Pertinent Vitals/Pain Pain Assessment: Faces Faces Pain Scale: Hurts little more Pain Location: bil knees Pain Descriptors / Indicators: Aching Pain Intervention(s): Limited activity within patient's tolerance;Monitored during session    Home Living Family/patient expects to be discharged to:: Private residence Living Arrangements: Children   Type of Home: House Home Access: Level entry       Home Equipment: Environmental consultant - 2 wheels;Cane - single point Additional Comments: daughter works Medical laboratory scientific officer, Pharmacist, hospital.  Has aide 3 days week--pt alone 2 hours those days, alone all day on M and F    Prior Function Level of Independence: Independent;Independent with assistive device(s)         Comments: has quad cane and RW but doesn't use either inside     Hand Dominance        Extremity/Trunk Assessment   Upper Extremity Assessment: Defer to OT evaluation;Overall WFL for tasks assessed           Lower Extremity Assessment: Overall WFL for tasks assessed      Cervical / Trunk Assessment: Kyphotic  Communication   Communication: HOH  Cognition Arousal/Alertness: Awake/alert Behavior During Therapy: WFL for tasks assessed/performed Overall Cognitive Status: Within Functional Limits for tasks assessed  General Comments General comments (skin integrity, edema, etc.): tegaderm placed to skin tear on L lower, posterior leg    Exercises        Assessment/Plan    PT Assessment Patient needs continued PT services  PT Diagnosis Difficulty walking   PT Problem List Decreased activity tolerance;Decreased balance;Decreased mobility  PT Treatment Interventions DME instruction;Gait training;Functional mobility training;Therapeutic activities;Patient/family education;Therapeutic exercise;Balance training   PT Goals (Current goals can be found in the Care Plan section) Acute Rehab PT Goals Patient Stated Goal: home PT Goal Formulation:  With patient Time For Goal Achievement: 08/22/15 Potential to Achieve Goals: Good    Frequency Min 3X/week   Barriers to discharge        Co-evaluation               End of Session   Activity Tolerance: Patient tolerated treatment well Patient left: in bed;with call bell/phone within reach;with bed alarm set           Time: [id number] PT Time Calculation (min) (ACUTE ONLY): 12 min   Charges:   PT Evaluation $PT Eval Low Complexity: 1 Procedure     PT G Codes:        Taryn Shellhammer August 18, 2015, 12:22 PM

## 2015-08-09 LAB — BASIC METABOLIC PANEL
ANION GAP: 8 (ref 5–15)
BUN: 18 mg/dL (ref 6–20)
CO2: 24 mmol/L (ref 22–32)
Calcium: 8.4 mg/dL — ABNORMAL LOW (ref 8.9–10.3)
Chloride: 111 mmol/L (ref 101–111)
Creatinine, Ser: 0.94 mg/dL (ref 0.61–1.24)
GLUCOSE: 123 mg/dL — AB (ref 65–99)
POTASSIUM: 3.5 mmol/L (ref 3.5–5.1)
Sodium: 143 mmol/L (ref 135–145)

## 2015-08-09 LAB — CBC
HCT: 41.2 % (ref 39.0–52.0)
Hemoglobin: 13.6 g/dL (ref 13.0–17.0)
MCH: 32.7 pg (ref 26.0–34.0)
MCHC: 33 g/dL (ref 30.0–36.0)
MCV: 99 fL (ref 78.0–100.0)
Platelets: 190 10*3/uL (ref 150–400)
RBC: 4.16 MIL/uL — ABNORMAL LOW (ref 4.22–5.81)
RDW: 13.9 % (ref 11.5–15.5)
WBC: 9.9 10*3/uL (ref 4.0–10.5)

## 2015-08-09 MED ORDER — ASPIRIN EC 81 MG PO TBEC
81.0000 mg | DELAYED_RELEASE_TABLET | Freq: Every day | ORAL | Status: DC
  Administered 2015-08-09 – 2015-08-14 (×6): 81 mg via ORAL
  Filled 2015-08-09 (×6): qty 1

## 2015-08-09 MED ORDER — QUETIAPINE FUMARATE 25 MG PO TABS
25.0000 mg | ORAL_TABLET | Freq: Every day | ORAL | Status: DC
  Administered 2015-08-09 – 2015-08-10 (×2): 25 mg via ORAL
  Filled 2015-08-09 (×2): qty 1

## 2015-08-09 MED ORDER — HALOPERIDOL LACTATE 5 MG/ML IJ SOLN
1.0000 mg | Freq: Four times a day (QID) | INTRAMUSCULAR | Status: DC | PRN
  Administered 2015-08-09 – 2015-08-13 (×2): 1 mg via INTRAVENOUS
  Filled 2015-08-09: qty 1

## 2015-08-09 MED ORDER — HALOPERIDOL LACTATE 5 MG/ML IJ SOLN
INTRAMUSCULAR | Status: AC
  Filled 2015-08-09: qty 1

## 2015-08-09 MED ORDER — PREDNISONE 5 MG PO TABS
5.0000 mg | ORAL_TABLET | Freq: Every day | ORAL | Status: DC
  Administered 2015-08-10: 5 mg via ORAL
  Filled 2015-08-09 (×2): qty 1

## 2015-08-09 MED ORDER — VANCOMYCIN HCL 10 G IV SOLR
1500.0000 mg | INTRAVENOUS | Status: DC
  Administered 2015-08-09 – 2015-08-10 (×2): 1500 mg via INTRAVENOUS
  Filled 2015-08-09 (×3): qty 1500

## 2015-08-09 NOTE — Progress Notes (Signed)
Patient Demographics  Derrick Andrews, is a 82 y.o. male, DOB - [birth year], [id number]  Admit date - 08/07/2015   Admitting Physician Bonnielee Haff, MD  Outpatient Primary MD for the patient is Horton Finer, MD  LOS - 2   Chief Complaint  Patient presents with  . Hypotension  . Weakness         Subjective:   Derrick Andrews today denies any chest pain, shortness of breath, fever 100.4 overnight. Assessment & Plan    Principal Problem:   UTI (urinary tract infection) Active Problems:   Cancer, bladder, neck (HCC)   Temporal arteritis (HCC)   Coronary artery disease   Cough   UTI (lower urinary tract infection)  UTI  -Initially on IV Rocephin, urine culture growing gram-positive cocci, possible enterococcus, so will discontinue IV Rocephin, obtain blood cultures, then start on IV vancomycin per pharmacy.  Hypotension - Dehydration and volume depletion, blood pressure is improving, continue gentle hydration, continue to hold Imdur - We'll continue with stress dose steroid taper, will change to by mouth prednisone  History of temporal arteritis - On chronic steroids, taper steroids down to his home dose  History of coronary artery disease - Denies any chest pain or shortness of breath - Troponins negative 3, EKG nonacute - Continue with aspirin  Hyperlipidemia  - Continue with statin   Acute delerium - Continue supportive care  Code Status: Full code  Family Communication: discussed with daughter at bedside  Disposition Plan: Pending PT evaluation   Procedures  None   Consults  None   Medications  Scheduled Meds: . aspirin EC  81 mg Oral Daily  . cefTRIAXone (ROCEPHIN)  IV  1 g Intravenous Q24H  . docusate sodium  100 mg Oral BID  . enoxaparin (LOVENOX) injection  40 mg Subcutaneous Q24H  . oxybutynin  5 mg Oral BID  . [START ON 08/10/2015] predniSONE  5  mg Oral Q breakfast  . sodium chloride flush  3 mL Intravenous Q12H   Continuous Infusions:   PRN Meds:.acetaminophen **OR** acetaminophen, albuterol, ondansetron **OR** ondansetron (ZOFRAN) IV  DVT Prophylaxis  Lovenox   Lab Results  Component Value Date   PLT 190 08/09/2015    Antibiotics    Anti-infectives    Start     Dose/Rate Route Frequency Ordered Stop   08/08/15 1400  cefTRIAXone (ROCEPHIN) injection 1 g  Status:  Discontinued     1 g Intramuscular Every 24 hours 08/07/15 2001 08/07/15 2008   08/08/15 1400  cefTRIAXone (ROCEPHIN) 1 g in dextrose 5 % 50 mL IVPB     1 g 100 mL/hr over 30 Minutes Intravenous Every 24 hours 08/07/15 2008     08/07/15 1615  cefTRIAXone (ROCEPHIN) injection 1 g     1 g Intramuscular  Once 08/07/15 1604 08/07/15 1625          Objective:   Filed Vitals:   08/08/15 0511 08/08/15 1323 08/08/15 2159 08/09/15 0416  BP: 118/41 137/53 140/63 124/73  Pulse: 51 80 97 94  Temp: 97.6 F (36.4 C)  100.4 F (38 C) 97.8 F (36.6 C)  TempSrc: Oral  Oral Oral  Resp: 18 20 20 18   Height:      Weight:  SpO2: 96% 97% 91% 91%    Wt Readings from Last 3 Encounters:  08/07/15 80.1 kg (176 lb 9.4 oz)  07/18/14 75.025 kg (165 lb 6.4 oz)  07/05/14 75.388 kg (166 lb 3.2 oz)     Intake/Output Summary (Last 24 hours) at 08/09/15 1707 Last data filed at 08/09/15 0446  Gross per 24 hour  Intake    360 ml  Output    250 ml  Net    110 ml     Physical Exam  Lethargic today, confused Supple Neck,No JVD,   Symmetrical Chest wall movement, Good air movement bilaterally, CTAB RRR,No Gallops,Rubs or new Murmurs, No Parasternal Heave +ve B.Sounds, Abd Soft, No tenderness, No organomegaly appriciated, No rebound - guarding or rigidity. No Cyanosis, Clubbing or edema, No new Rash or bruise     Data Review   Micro Results Recent Results (from the past 240 hour(s))  Culture, Urine     Status: None (Preliminary result)   Collection Time:  08/07/15  3:55 PM  Result Value Ref Range Status   Specimen Description URINE, CLEAN CATCH  Final   Special Requests NONE  Final   Culture   Final    >=100,000 COLONIES/mL GRAM POSITIVE COCCI CULTURE REINCUBATED FOR BETTER GROWTH Performed at [facility]    Report Status PENDING  Incomplete    Radiology Reports Dg Chest Port 1 View  08/07/2015  CLINICAL DATA:  Lethargy; fall from bed 1 day prior. Chronic cough. History of prostate carcinoma EXAM: PORTABLE CHEST 1 VIEW COMPARISON:  August 02, 2014 FINDINGS: There is widespread pulmonary fibrotic type change, stable. No frank edema or consolidation. Heart is upper normal in size with pulmonary vascularity within normal limits. No adenopathy. No blastic or lytic bone lesions are apparent. A prior lower thoracic vertebral body fracture is better seen on prior study with lateral view included. IMPRESSION: Widespread pulmonary fibrotic type change, stable. No frank edema or consolidation. No change in cardiac silhouette. Electronically Signed   By: Lowella Grip III M.D.   On: 08/07/2015 17:36     CBC  Recent Labs Lab 08/07/15 1227 08/08/15 0215 08/09/15 0446  WBC 19.7* 12.1* 9.9  HGB 14.8 12.2* 13.6  HCT 45.3 37.0* 41.2  PLT 179 144* 190  MCV 102.0* 101.4* 99.0  MCH 33.3 33.4 32.7  MCHC 32.7 33.0 33.0  RDW 14.3 14.4 13.9    Chemistries   Recent Labs Lab 08/07/15 1227 08/08/15 0215 08/09/15 0446  NA 139 137 143  K 3.8 4.1 3.5  CL 106 108 111  CO2 23 23 24   GLUCOSE 145* 156* 123*  BUN 20 24* 18  CREATININE 0.96 0.96 0.94  CALCIUM 8.6* 8.0* 8.4*  AST  --  24  --   ALT  --  12*  --   ALKPHOS  --  40  --   BILITOT  --  0.9  --    ------------------------------------------------------------------------------------------------------------------ estimated creatinine clearance is 53.3 mL/min (by C-G formula based on Cr of  0.94). ------------------------------------------------------------------------------------------------------------------ No results for input(s): HGBA1C in the last 72 hours. ------------------------------------------------------------------------------------------------------------------ No results for input(s): CHOL, HDL, LDLCALC, TRIG, CHOLHDL, LDLDIRECT in the last 72 hours. ------------------------------------------------------------------------------------------------------------------ No results for input(s): TSH, T4TOTAL, T3FREE, THYROIDAB in the last 72 hours.  Invalid input(s): FREET3 ------------------------------------------------------------------------------------------------------------------ No results for input(s): VITAMINB12, FOLATE, FERRITIN, TIBC, IRON, RETICCTPCT in the last 72 hours.  Coagulation profile No results for input(s): INR, PROTIME in the last 168 hours.  No results for input(s): DDIMER in  the last 72 hours.  Cardiac Enzymes  Recent Labs Lab 08/07/15 2050 08/08/15 0215 08/08/15 0750  TROPONINI 0.03 <0.03 <0.03   ------------------------------------------------------------------------------------------------------------------ Invalid input(s): POCBNP     Time Spent in minutes   25 minutes   Royelle Hinchman M.D on 08/09/2015 at 5:07 PM  Between 7am to 7pm - Pager - [phone]  After 7pm go to [email or web address] - password [email or web address]  [facility]   Office  [phone]

## 2015-08-09 NOTE — Progress Notes (Signed)
OT Cancellation Note  Patient Details Name: Derrick Andrews MRN: [id number] DOB: March 14, 1918   Cancelled Treatment:    Reason Eval/Treat Not Completed: Other (comment).  Noted pt was more confused with PT today. Will check back tomorrow.  [address], OTR/L [id number] 08/09/2015 08/09/2015, 12:52 PM

## 2015-08-09 NOTE — Progress Notes (Signed)
Physical Therapy Treatment Patient Details Name: Derrick Andrews MRN: [id number] DOB: November 14, 1917 Today's Date: Sep 02, 2015    History of Present Illness 82 yo male  admitted for weakness, recent falls.  H/O CAD and bladder CA    PT Comments    Pt  Needing more assist today with bed mobility and transfers, very confused; caregiver present  Follow Up Recommendations  Home health PT;SNF;Supervision/Assistance - 24 hour     Equipment Recommendations  None recommended by PT    Recommendations for Other Services       Precautions / Restrictions Precautions Precautions: Fall Restrictions Weight Bearing Restrictions: No    Mobility  Bed Mobility Overal bed mobility: Needs Assistance Bed Mobility: Sit to Supine       Sit to supine: Mod assist   General bed mobility comments: assist with LEs onto bed  Transfers Overall transfer level: Needs assistance Equipment used: None Transfers: Sit to/from [employer] Transfers Sit to Stand: Mod assist;+2 safety/equipment Stand pivot transfers: Mod assist;+2 safety/equipment;+2 physical assistance       General transfer comment: assist to wt shift to stand, incr assist required to pivot to bed, difficulty picking up feet and stepping laterally to bed  Ambulation/Gait                 Stairs            Wheelchair Mobility    Modified Rankin (Stroke Patients Only)       Balance                                    Cognition Arousal/Alertness: Awake/alert Behavior During Therapy: Flat affect Overall Cognitive Status: Impaired/Different from baseline Area of Impairment: Orientation;Following commands;Awareness Orientation Level: Disoriented to;Situation;Place;Time   Memory: Decreased short-term memory Following Commands: Follows one step commands with increased time       General Comments: pt very confused today    Exercises      General Comments        Pertinent Vitals/Pain Pain  Assessment: No/denies pain    Home Living                      Prior Function            PT Goals (current goals can now be found in the care plan section) Acute Rehab PT Goals Patient Stated Goal: home PT Goal Formulation: With patient Time For Goal Achievement: 08/22/15 Potential to Achieve Goals: Good Progress towards PT goals: Progressing toward goals    Frequency  Min 3X/week    PT Plan Current plan remains appropriate    Co-evaluation             End of Session   Activity Tolerance: Patient limited by fatigue Patient left: in bed;with call bell/phone within reach;with nursing/sitter in room     Time: 0919-0929 PT Time Calculation (min) (ACUTE ONLY): 10 min  Charges:  $Therapeutic Activity: 8-22 mins                    G Codes:      Malin Cervini Sep 02, 2015, 10:41 AM

## 2015-08-09 NOTE — Progress Notes (Signed)
SLP spoke to MD regarding Derrick Andrews.  Md indicated to cancel order.  Please reorder if indicated. Luanna Salk, [place] [facility] SLP [phone]

## 2015-08-09 NOTE — Progress Notes (Signed)
Pharmacy Antibiotic Note  Derrick Andrews is a 82 y.o. male admitted on 08/07/2015 with UTI.  Pharmacy has been consulted for vancomycin dosing.  Plan: Vancomycin 1500mg  IV every 24 hours.  Goal trough 10-15 mcg/mL.  Height: 6\' 3"  (190.5 cm) Weight: 176 lb 9.4 oz (80.1 kg) IBW/kg (Calculated) : 84.5  Temp (24hrs), Avg:99.1 F (37.3 C), Min:97.8 F (36.6 C), Max:100.4 F (38 C)   Recent Labs Lab 08/07/15 1227 08/07/15 1758 08/08/15 0215 08/09/15 0446  WBC 19.7*  --  12.1* 9.9  CREATININE 0.96  --  0.96 0.94  LATICACIDVEN  --  0.97  --   --     Estimated Creatinine Clearance: 53.3 mL/min (by C-G formula based on Cr of 0.94).    Allergies  Allergen Reactions  . Mercury Ammoniated [Ammoniated Mercury] Other (See Comments)    Blisters    Antimicrobials this admission: 2/28 >> Rocephin >> 3/2 3/2 >> Vancomycin >>  Dose adjustments this admission: ---  Microbiology results: 2/28 Influenza panel: negative 2/28 UCx: >100k GPC (reincubating) 3/02 BCx: sent  Thank you for allowing pharmacy to be a part of this patient's care.  Peggyann Juba, PharmD, BCPS Pager: [phone] 08/09/2015 5:33 PM

## 2015-08-09 NOTE — NC FL2 (Signed)
[place] LEVEL OF CARE SCREENING TOOL     IDENTIFICATION  Patient Name: Derrick Andrews Birthdate: 1918/01/29 Sex: male Admission Date (Current Location): 08/07/2015  [facility] and [place] Number:  Herbalist and Address:  [facility],  [place] [address] [place]      Provider Number: [id number]  Attending Physician Name and Address:  Albertine Patricia, MD  Relative Name and Phone Number:       Current Level of Care: Hospital Recommended Level of Care: [place] Prior Approval Number:    Date Approved/Denied:   PASRR Number: [id number]  Discharge Plan: SNF    Current Diagnoses: Patient Active Problem List   Diagnosis Date Noted  . UTI (urinary tract infection) 08/07/2015  . Cough 08/07/2015  . UTI (lower urinary tract infection) 08/07/2015  . Temporal arteritis ([place])   . Coronary artery disease   . GERD (gastroesophageal reflux disease)   . Cancer, bladder, neck ([place]) 01/19/2014  . Dyspnea 06/29/2013  . Hyperlipidemia 06/29/2013  . Lumbosacral spondylosis without myelopathy 09/04/2011    Orientation RESPIRATION BLADDER Height & Weight     Self  Normal Incontinent Weight: 176 lb 9.4 oz (80.1 kg) Height:  6\' 3"  (190.5 cm)  BEHAVIORAL SYMPTOMS/MOOD NEUROLOGICAL BOWEL NUTRITION STATUS   (no behaviors)  (NONE) Continent Diet (Diet Dys 3 thin liquids)  AMBULATORY STATUS COMMUNICATION OF NEEDS Skin   Extensive Assist (+2 assist for Transfers/Ambulations) Verbally Normal                       Personal Care Assistance Level of Assistance  Bathing, Feeding, Dressing Bathing Assistance: Limited assistance Feeding assistance: Independent Dressing Assistance: Limited assistance     Functional Limitations Info  Sight, Hearing, Speech Sight Info: Adequate Hearing Info: Impaired Speech Info: Adequate    SPECIAL CARE FACTORS FREQUENCY  PT (By licensed PT), OT (By licensed OT)     PT Frequency: 5 x a  week OT Frequency: 5 x a week            Contractures Contractures Info: Not present    Additional Factors Info  Code Status, Allergies Code Status Info: FULL code status Allergies Info: Mercury Ammoniated           Current Medications (08/09/2015):  This is the current hospital active medication list Current Facility-Administered Medications  Medication Dose Route Frequency Provider Last Rate Last Dose  . acetaminophen (TYLENOL) tablet 650 mg  650 mg Oral Q6H PRN Bonnielee Haff, MD       Or  . acetaminophen (TYLENOL) suppository 650 mg  650 mg Rectal Q6H PRN Bonnielee Haff, MD      . albuterol (PROVENTIL) (2.5 MG/3ML) 0.083% nebulizer solution 2.5 mg  2.5 mg Nebulization Q2H PRN Bonnielee Haff, MD      . aspirin EC tablet 81 mg  81 mg Oral Daily Albertine Patricia, MD   81 mg at 08/09/15 1118  . cefTRIAXone (ROCEPHIN) 1 g in dextrose 5 % 50 mL IVPB  1 g Intravenous Q24H Bonnielee Haff, MD   1 g at 08/08/15 1605  . docusate sodium (COLACE) capsule 100 mg  100 mg Oral BID Bonnielee Haff, MD   100 mg at 08/09/15 1118  . enoxaparin (LOVENOX) injection 40 mg  40 mg Subcutaneous Q24H Bonnielee Haff, MD   40 mg at 08/08/15 2110  . hydrocortisone sodium succinate (SOLU-CORTEF) 100 MG injection 50 mg  50 mg Intravenous Q12H Dawood  S Elgergawy, MD   50 mg at 08/09/15 1118  . ondansetron (ZOFRAN) tablet 4 mg  4 mg Oral Q6H PRN Bonnielee Haff, MD       Or  . ondansetron (ZOFRAN) injection 4 mg  4 mg Intravenous Q6H PRN Bonnielee Haff, MD      . oxybutynin (DITROPAN) tablet 5 mg  5 mg Oral BID Bonnielee Haff, MD   5 mg at 08/09/15 1118  . sodium chloride flush (NS) 0.9 % injection 3 mL  3 mL Intravenous Q12H Bonnielee Haff, MD   3 mL at 08/09/15 1119     Discharge Medications: Please see discharge summary for a list of discharge medications.  Relevant Imaging Results:  Relevant Lab Results:   Additional Information SSN: [id number]  Nithin Demeo, [place] [facility], LCSW

## 2015-08-10 LAB — CBC
HCT: 38.8 % — ABNORMAL LOW (ref 39.0–52.0)
HEMOGLOBIN: 13 g/dL (ref 13.0–17.0)
MCH: 32.7 pg (ref 26.0–34.0)
MCHC: 33.5 g/dL (ref 30.0–36.0)
MCV: 97.7 fL (ref 78.0–100.0)
Platelets: 183 10*3/uL (ref 150–400)
RBC: 3.97 MIL/uL — ABNORMAL LOW (ref 4.22–5.81)
RDW: 13.7 % (ref 11.5–15.5)
WBC: 9.6 10*3/uL (ref 4.0–10.5)

## 2015-08-10 LAB — BASIC METABOLIC PANEL
Anion gap: 9 (ref 5–15)
BUN: 13 mg/dL (ref 6–20)
CALCIUM: 8.2 mg/dL — AB (ref 8.9–10.3)
CHLORIDE: 109 mmol/L (ref 101–111)
CO2: 23 mmol/L (ref 22–32)
CREATININE: 0.79 mg/dL (ref 0.61–1.24)
Glucose, Bld: 123 mg/dL — ABNORMAL HIGH (ref 65–99)
Potassium: 2.9 mmol/L — ABNORMAL LOW (ref 3.5–5.1)
SODIUM: 141 mmol/L (ref 135–145)

## 2015-08-10 LAB — MAGNESIUM: MAGNESIUM: 2 mg/dL (ref 1.7–2.4)

## 2015-08-10 MED ORDER — POTASSIUM CHLORIDE 10 MEQ/100ML IV SOLN
10.0000 meq | INTRAVENOUS | Status: AC
  Administered 2015-08-10 (×3): 10 meq via INTRAVENOUS
  Filled 2015-08-10 (×4): qty 100

## 2015-08-10 MED ORDER — POTASSIUM CHLORIDE CRYS ER 20 MEQ PO TBCR
30.0000 meq | EXTENDED_RELEASE_TABLET | ORAL | Status: AC
  Administered 2015-08-10: 30 meq via ORAL
  Filled 2015-08-10: qty 1

## 2015-08-10 NOTE — Care Management Important Message (Signed)
Important Message  Patient Details  Name: Derrick Andrews MRN: [id number] Date of Birth: [birth year]   Medicare Important Message Given:  Yes    Camillo Flaming 08/10/2015, 2:38 [place] Message  Patient Details  Name: Derrick Andrews MRN: [id number] Date of Birth: 15-Jun-1917   Medicare Important Message Given:  Yes    Camillo Flaming 08/10/2015, 2:38 PM

## 2015-08-10 NOTE — Clinical Social Work Placement (Signed)
   CLINICAL SOCIAL WORK PLACEMENT  NOTE  Date:  08/10/2015  Patient Details  Name: Derrick Andrews MRN: [id number] Date of Birth: [birth year]  Clinical Social Work is seeking post-discharge placement for this patient at the [place] level of care (*CSW will initial, date and re-position this form in  chart as items are completed):  Yes   Patient/family provided with [place] Work Department's list of facilities offering this level of care within the geographic area requested by the patient (or if unable, by the patient's family).  Yes   Patient/family informed of their freedom to choose among providers that offer the needed level of care, that participate in Medicare, Medicaid or managed care program needed by the patient, have an available bed and are willing to accept the patient.  Yes   Patient/family informed of [facility]'s ownership interest in [facility] and [facility], as well as of the fact that they are under no obligation to receive care at these facilities.  PASRR submitted to EDS on 08/09/15     PASRR number received on 08/09/15     Existing PASRR number confirmed on       FL2 transmitted to all facilities in geographic area requested by pt/family on 08/09/15     FL2 transmitted to all facilities within larger geographic area on       Patient informed that his/her managed care company has contracts with or will negotiate with certain facilities, including the following:        Yes   Patient/family informed of bed offers received.  Patient chooses bed at [facility]     Physician recommends and patient chooses bed at      Patient to be transferred to [facility] on  .  Patient to be transferred to facility by       Patient family notified on   of transfer.  Name of family member notified:        PHYSICIAN Please sign FL2     Additional Comment:    _______________________________________________ Ladell Pier,  LCSW 08/10/2015, 12:39 PM

## 2015-08-10 NOTE — Progress Notes (Addendum)
Patient Demographics  Derrick Andrews, is a 82 y.o. male, DOB - Nov 04, 1917, [id number]  Admit date - 08/07/2015   Admitting Physician Bonnielee Haff, MD  Outpatient Primary MD for the patient is Horton Finer, MD  LOS - 3   Chief Complaint  Patient presents with  . Hypotension  . Weakness         Subjective:   Derrick Andrews today denies any chest pain, shortness of breath, afebrile.   Assessment & Plan    Principal Problem:   UTI (urinary tract infection) Active Problems:   Cancer, bladder, neck (HCC)   Temporal arteritis (HCC)   Coronary artery disease   Cough   UTI (lower urinary tract infection)  UTI  -Initially on IV Rocephin, urine culture growing  enterococcus, positioned IV Rocephin to IV vancomycin on 2/3 , sensitivity still pending   Hypotension - Dehydration and volume depletion, blood pressure is improving, continue gentle hydration, continue to hold Imdur - We'll continue with stress dose steroid taper, will change to by mouth prednisone  History of temporal arteritis - On chronic steroids, taper steroids down to his home dose  History of coronary artery disease - Denies any chest pain or shortness of breath - Troponins negative 3, EKG nonacute - Continue with aspirin  Hyperlipidemia  - Continue with statin   Hypokalemia - repleted, recheck in a.m.  Acute delerium - And when necessary Haldol, started on Seroquel daily at bedtime  Code Status: Full code  Family Communication: discussed with daughter at bedside  Disposition Plan: SNF in am.   Procedures  None   Consults  None   Medications  Scheduled Meds: . aspirin EC  81 mg Oral Daily  . docusate sodium  100 mg Oral BID  . enoxaparin (LOVENOX) injection  40 mg Subcutaneous Q24H  . oxybutynin  5 mg Oral BID  . potassium chloride  30 mEq Oral Q4H  . predniSONE  5 mg Oral Q breakfast   . QUEtiapine  25 mg Oral QHS  . sodium chloride flush  3 mL Intravenous Q12H  . vancomycin  1,500 mg Intravenous Q24H   Continuous Infusions:   PRN Meds:.acetaminophen **OR** acetaminophen, albuterol, haloperidol lactate, ondansetron **OR** ondansetron (ZOFRAN) IV  DVT Prophylaxis  Lovenox   Lab Results  Component Value Date   PLT 183 08/10/2015    Antibiotics    Anti-infectives    Start     Dose/Rate Route Frequency Ordered Stop   08/09/15 1800  vancomycin (VANCOCIN) 1,500 mg in sodium chloride 0.9 % 500 mL IVPB     1,500 mg 250 mL/hr over 120 Minutes Intravenous Every 24 hours 08/09/15 1722     08/08/15 1400  cefTRIAXone (ROCEPHIN) injection 1 g  Status:  Discontinued     1 g Intramuscular Every 24 hours 08/07/15 2001 08/07/15 2008   08/08/15 1400  cefTRIAXone (ROCEPHIN) 1 g in dextrose 5 % 50 mL IVPB  Status:  Discontinued     1 g 100 mL/hr over 30 Minutes Intravenous Every 24 hours 08/07/15 2008 08/09/15 1711   08/07/15 1615  cefTRIAXone (ROCEPHIN) injection 1 g     1 g Intramuscular  Once 08/07/15 1604 08/07/15 1625          Objective:  Filed Vitals:   08/09/15 2247 08/10/15 0631 08/10/15 1202 08/10/15 1400  BP: 160/83 117/51 120/53 118/55  Pulse: 106 93 100 98  Temp: 98.9 F (37.2 C) 98.6 F (37 C) 98.4 F (36.9 C) 98.5 F (36.9 C)  TempSrc: Oral Oral Oral Oral  Resp:  20 18 20   Height:      Weight:      SpO2: 90% 91% 97% 97%    Wt Readings from Last 3 Encounters:  08/07/15 80.1 kg (176 lb 9.4 oz)  07/18/14 75.025 kg (165 lb 6.4 oz)  07/05/14 75.388 kg (166 lb 3.2 oz)     Intake/Output Summary (Last 24 hours) at 08/10/15 1523 Last data filed at 08/10/15 1101  Gross per 24 hour  Intake    670 ml  Output      0 ml  Net    670 ml     Physical Exam  Lethargic today, confused Supple Neck,No JVD,   Symmetrical Chest wall movement, Good air movement bilaterally, CTAB RRR,No Gallops,Rubs or new Murmurs, No Parasternal Heave +ve B.Sounds, Abd  Soft, No tenderness, No organomegaly appriciated, No rebound - guarding or rigidity. No Cyanosis, Clubbing or edema, No new Rash or bruise     Data Review   Micro Results Recent Results (from the past 240 hour(s))  Culture, Urine     Status: None (Preliminary result)   Collection Time: 08/07/15  3:55 PM  Result Value Ref Range Status   Specimen Description URINE, CLEAN CATCH  Final   Special Requests NONE  Final   Culture   Final    >=100,000 COLONIES/mL ENTEROCOCCUS SPECIES SUSCEPTIBILITIES TO FOLLOW Performed at [facility]    Report Status PENDING  Incomplete  Culture, blood (Routine X 2) w Reflex to ID Panel     Status: None (Preliminary result)   Collection Time: 08/09/15  5:27 PM  Result Value Ref Range Status   Specimen Description BLOOD RIGHT HAND  Final   Special Requests   Final    BOTTLES DRAWN AEROBIC AND ANAEROBIC BLUE 10CC RED 3CC   Culture   Final    NO GROWTH < 24 HOURS Performed at [facility]    Report Status PENDING  Incomplete  Culture, blood (Routine X 2) w Reflex to ID Panel     Status: None (Preliminary result)   Collection Time: 08/09/15  5:30 PM  Result Value Ref Range Status   Specimen Description BLOOD RIGHT ARM  Final   Special Requests BOTTLES DRAWN AEROBIC AND ANAEROBIC 10CC  Final   Culture   Final    NO GROWTH < 24 HOURS Performed at [facility]    Report Status PENDING  Incomplete    Radiology Reports Dg Chest Port 1 View  08/07/2015  CLINICAL DATA:  Lethargy; fall from bed 1 day prior. Chronic cough. History of prostate carcinoma EXAM: PORTABLE CHEST 1 VIEW COMPARISON:  August 02, 2014 FINDINGS: There is widespread pulmonary fibrotic type change, stable. No frank edema or consolidation. Heart is upper normal in size with pulmonary vascularity within normal limits. No adenopathy. No blastic or lytic bone lesions are apparent. A prior lower thoracic vertebral body fracture is better seen on prior study with lateral  view included. IMPRESSION: Widespread pulmonary fibrotic type change, stable. No frank edema or consolidation. No change in cardiac silhouette. Electronically Signed   By: Lowella Grip III M.D.   On: 08/07/2015 17:36     CBC  Recent Labs Lab 08/07/15  1227 08/08/15 0215 08/09/15 0446 08/10/15 0508  WBC 19.7* 12.1* 9.9 9.6  HGB 14.8 12.2* 13.6 13.0  HCT 45.3 37.0* 41.2 38.8*  PLT 179 144* 190 183  MCV 102.0* 101.4* 99.0 97.7  MCH 33.3 33.4 32.7 32.7  MCHC 32.7 33.0 33.0 33.5  RDW 14.3 14.4 13.9 13.7    Chemistries   Recent Labs Lab 08/07/15 1227 08/08/15 0215 08/09/15 0446 08/10/15 0508  NA 139 137 143 141  K 3.8 4.1 3.5 2.9*  CL 106 108 111 109  CO2 23 23 24 23   GLUCOSE 145* 156* 123* 123*  BUN 20 24* 18 13  CREATININE 0.96 0.96 0.94 0.79  CALCIUM 8.6* 8.0* 8.4* 8.2*  MG  --   --   --  2.0  AST  --  24  --   --   ALT  --  12*  --   --   ALKPHOS  --  40  --   --   BILITOT  --  0.9  --   --    ------------------------------------------------------------------------------------------------------------------ estimated creatinine clearance is 62.6 mL/min (by C-G formula based on Cr of 0.79). ------------------------------------------------------------------------------------------------------------------ No results for input(s): HGBA1C in the last 72 hours. ------------------------------------------------------------------------------------------------------------------ No results for input(s): CHOL, HDL, LDLCALC, TRIG, CHOLHDL, LDLDIRECT in the last 72 hours. ------------------------------------------------------------------------------------------------------------------ No results for input(s): TSH, T4TOTAL, T3FREE, THYROIDAB in the last 72 hours.  Invalid input(s): FREET3 ------------------------------------------------------------------------------------------------------------------ No results for input(s): VITAMINB12, FOLATE, FERRITIN, TIBC, IRON,  RETICCTPCT in the last 72 hours.  Coagulation profile No results for input(s): INR, PROTIME in the last 168 hours.  No results for input(s): DDIMER in the last 72 hours.  Cardiac Enzymes  Recent Labs Lab 08/07/15 2050 08/08/15 0215 08/08/15 0750  TROPONINI 0.03 <0.03 <0.03   ------------------------------------------------------------------------------------------------------------------ Invalid input(s): POCBNP     Time Spent in minutes   25 minutes   ELGERGAWY, DAWOOD M.D on 08/10/2015 at 3:23 PM  Between 7am to 7pm - Pager - [phone]  After 7pm go to [email or web address] - password [facility]  [facility]   Office  [phone]

## 2015-08-10 NOTE — Progress Notes (Signed)
CSW continuing to follow.   CSW confirmed with pt daughter, Manuela Schwartz this morning decision for SNF is [facility] and [facility].   CSW submitted pt insurance authorization through [facility].   Per MD, pt not yet medically ready for discharge, but anticipate potential d/c tomorrow.   CSW confirmed with [facility] and [facility] that facility can accept pt tomorrow.   CSW received authorization from [facility] #: [id number] RUQ level: RVB). CSW confirmed with [facility] that facility had received authorization from [facility] as well.   CSW updated pt daughter that [facility] can accept over the weekend if pt medically ready and insurance authorization has been received.  CSW to continue to follow to provide support and assist with pt disposition needs.   Alison Murray, MSW, [place] Work [phone]

## 2015-08-10 NOTE — Clinical Social Work Note (Signed)
Clinical Social Work Assessment  Patient Details  Name: Derrick Andrews MRN: [id number] Date of Birth: Dec 01, 1917  Date of referral:  08/09/15               Reason for consult:  Discharge Planning                Permission sought to share information with:  Family Supports Permission granted to share information::     Name::     Jaisen Harbeck  Agency::     Relationship::  daughter  Contact Information:  [phone]  Housing/Transportation Living arrangements for the past 2 months:  Single Family Home Source of Information:  Adult Children Patient Interpreter Needed:  None Criminal Activity/Legal Involvement Pertinent to Current Situation/Hospitalization:  No - Comment as needed Significant Relationships:  Adult Children Lives with:  Self Do you feel safe going back to the place where you live?  No Need for family participation in patient care:  Yes (Comment)  Care giving concerns:  Pt admitted from home. Pt has support from pt daughters, but pt does not have 24 hour care. Pt confused which is not baseline for pt and pt daughter does not feel pt would be safe at home at this time.    Social Worker assessment / plan:    CSW received referral for New SNF.  Pt confused and unable to participate in assessment. CSW contacted pt daughter, Manuela Schwartz via telephone. CSW introduced self and explained role. CSW provided supportive listening as pt daughter discussed the change in pt physical needs and mental status. Pt daughter discussed that she knows pt will be hesitant about SNF, but short term rehab will be needed for pt to regain some strength. Pt daughter agreeable to [facility] search.  CSW completed FL2 and initiated SNF search to [facility]. Pt has [employer] which requires authorization prior to pt transfer to SNF.   CSW to follow up with pt daughter re: SNF bed offers.   CSW to continue to follow to provide support and assist with pt discharge planning needs.    Employment status:  Retired Nurse, adult PT Recommendations:  [place], Home with [place], [place] / Referral to community resources:  [place]  Patient/Family's Response to care:  Pt disoriented and unable to participate in assessment. Pt daughter supportive and actively involved in pt care. Pt daughter states pt does not have 24 hour care at home and rehab will be needed.   Patient/Family's Understanding of and Emotional Response to Diagnosis, Current Treatment, and Prognosis:  Pt daughter displayed understanding surrounding pt diagnosis, treatment plan, and post hospital discharge recommendations.   Emotional Assessment Appearance:  Appears stated age Attitude/Demeanor/Rapport:  Unable to Assess Affect (typically observed):  Unable to Assess Orientation:  Oriented to Self Alcohol / Substance use:  Not Applicable Psych involvement (Current and /or in the community):  No (Comment)  Discharge Needs  Concerns to be addressed:  Discharge Planning Concerns Readmission within the last 30 days:  No Current discharge risk:  Lives alone Barriers to Discharge:  Continued Medical Work up   Ladell Pier, [place] 08/10/2015, 8:20 AM  [phone]

## 2015-08-10 NOTE — Clinical Social Work Placement (Signed)
   CLINICAL SOCIAL WORK PLACEMENT  NOTE  Date:  08/10/2015  Patient Details  Name: Derrick Andrews MRN: [id number] Date of Birth: Jun 12, 1917  Clinical Social Work is seeking post-discharge placement for this patient at the [place] level of care (*CSW will initial, date and re-position this form in  chart as items are completed):  Yes   Patient/family provided with [place] Work Department's list of facilities offering this level of care within the geographic area requested by the patient (or if unable, by the patient's family).  Yes   Patient/family informed of their freedom to choose among providers that offer the needed level of care, that participate in Medicare, Medicaid or managed care program needed by the patient, have an available bed and are willing to accept the patient.  Yes   Patient/family informed of [place]'s ownership interest in [facility] and [facility], as well as of the fact that they are under no obligation to receive care at these facilities.  PASRR submitted to EDS on 08/09/15     PASRR number received on 08/09/15     Existing PASRR number confirmed on       FL2 transmitted to all facilities in geographic area requested by pt/family on 08/09/15     FL2 transmitted to all facilities within larger geographic area on       Patient informed that his/her managed care company has contracts with or will negotiate with certain facilities, including the following:        Yes   Patient/family informed of bed offers received.  Patient chooses bed at       Physician recommends and patient chooses bed at      Patient to be transferred to   on  .  Patient to be transferred to facility by       Patient family notified on   of transfer.  Name of family member notified:        PHYSICIAN Please sign FL2     Additional Comment:    _______________________________________________ Ladell Pier, LCSW 08/10/2015, 8:25  AM

## 2015-08-10 NOTE — Progress Notes (Signed)
CSW continuing to follow.   CSW followed up with pt daughter, Manuela Schwartz at bedside to discuss SNF bed offers.   Pt daughter expressed interest in [facility] and [facility]. Pt daughter plans to speak with pt other daughter and confirm with CSW in AM about choice.   CSW confirmed with [facility] and [facility] that facility could accept pt on 3/3.   CSW to submit insurance authorization with [facility] in AM once final decision made.   CSW to continue to follow for pt anticipated discharge on 08/10/15.  Alison Murray, MSW, [place] [facility] [phone]

## 2015-08-11 ENCOUNTER — Inpatient Hospital Stay (HOSPITAL_COMMUNITY): Payer: Medicare Other

## 2015-08-11 DIAGNOSIS — R05 Cough: Secondary | ICD-10-CM

## 2015-08-11 LAB — BASIC METABOLIC PANEL
Anion gap: 8 (ref 5–15)
BUN: 16 mg/dL (ref 6–20)
CHLORIDE: 106 mmol/L (ref 101–111)
CO2: 23 mmol/L (ref 22–32)
CREATININE: 0.76 mg/dL (ref 0.61–1.24)
Calcium: 7.8 mg/dL — ABNORMAL LOW (ref 8.9–10.3)
GFR calc Af Amer: >60 mL/min (ref >60)
GFR calc non Af Amer: >60 mL/min (ref >60)
GLUCOSE: 135 mg/dL — AB (ref 65–99)
Potassium: 3.2 mmol/L — ABNORMAL LOW (ref 3.5–5.1)
SODIUM: 137 mmol/L (ref 135–145)

## 2015-08-11 MED ORDER — PREDNISONE 1 MG PO TABS
2.0000 mg | ORAL_TABLET | Freq: Every day | ORAL | Status: DC
  Administered 2015-08-11 – 2015-08-14 (×4): 2 mg via ORAL
  Filled 2015-08-11 (×5): qty 2

## 2015-08-11 MED ORDER — FUROSEMIDE 10 MG/ML IJ SOLN
40.0000 mg | Freq: Once | INTRAMUSCULAR | Status: AC
  Administered 2015-08-11: 40 mg via INTRAVENOUS
  Filled 2015-08-11: qty 4

## 2015-08-11 MED ORDER — GUAIFENESIN ER 600 MG PO TB12
1200.0000 mg | ORAL_TABLET | Freq: Two times a day (BID) | ORAL | Status: DC
  Administered 2015-08-11 – 2015-08-14 (×7): 1200 mg via ORAL
  Filled 2015-08-11 (×7): qty 2

## 2015-08-11 MED ORDER — POTASSIUM CHLORIDE CRYS ER 20 MEQ PO TBCR
30.0000 meq | EXTENDED_RELEASE_TABLET | ORAL | Status: AC
  Administered 2015-08-11 (×2): 30 meq via ORAL
  Filled 2015-08-11 (×2): qty 1

## 2015-08-11 MED ORDER — AMOXICILLIN 250 MG PO CAPS
500.0000 mg | ORAL_CAPSULE | Freq: Three times a day (TID) | ORAL | Status: DC
Start: 2015-08-11 — End: 2015-08-14
  Administered 2015-08-11 – 2015-08-14 (×9): 500 mg via ORAL
  Filled 2015-08-11 (×10): qty 2

## 2015-08-11 NOTE — Progress Notes (Signed)
Patient Demographics  Derrick Andrews, is a 82 y.o. male, DOB - July 26, 1917, [id number]  Admit date - 08/07/2015   Admitting Physician Bonnielee Haff, MD  Outpatient Primary MD for the patient is Horton Finer, MD  LOS - 4   Chief Complaint  Patient presents with  . Hypotension  . Weakness         Subjective:   Derrick Andrews today denies any chest pain, shortness of breath, afebrile.   Assessment & Plan    Principal Problem:   UTI (urinary tract infection) Active Problems:   Cancer, bladder, neck (HCC)   Temporal arteritis (HCC)   Coronary artery disease   Cough   UTI (lower urinary tract infection)  UTI  -Initially on IV Rocephin, urine culture growing  Enterococcus,changed IV Rocephin to IV vancomycin on 3/2 , sensitivity back today showing ampicillin sensitive, so will discontinue IV vancomycin and start on amoxicillin   Hypotension - Dehydration and volume depletion, blood pressure is improving, continue gentle hydration, continue to hold Imdur - Initially on steroid stress dose hydrocortisone, tapered back to home dose steroid   History of temporal arteritis - On chronic steroids, taper steroids down to his home dose  Cough , hypoxia this a.m.  - X-ray with evidence of volume overload, will start on IV Lasix, repeat x-ray in a.m.   History of coronary artery disease - Denies any chest pain or shortness of breath - Troponins negative 3, EKG nonacute - Continue with aspirin  Hyperlipidemia  - Continue with statin   Hypokalemia - repleted, recheck in a.m.  Acute delerium - And when necessary Haldol, started on Seroquel daily at bedtime  Code Status: Full code  Family Communication: discussed with daughter at bedside  Disposition Plan: SNF in am.   Procedures  None   Consults  None   Medications  Scheduled Meds: . amoxicillin  500 mg Oral 3 times  per day  . aspirin EC  81 mg Oral Daily  . docusate sodium  100 mg Oral BID  . enoxaparin (LOVENOX) injection  40 mg Subcutaneous Q24H  . guaiFENesin  1,200 mg Oral BID  . oxybutynin  5 mg Oral BID  . potassium chloride  30 mEq Oral Q3H  . predniSONE  2 mg Oral Q breakfast  . sodium chloride flush  3 mL Intravenous Q12H   Continuous Infusions:   PRN Meds:.acetaminophen **OR** acetaminophen, albuterol, haloperidol lactate, ondansetron **OR** ondansetron (ZOFRAN) IV  DVT Prophylaxis  Lovenox   Lab Results  Component Value Date   PLT 183 08/10/2015    Antibiotics    Anti-infectives    Start     Dose/Rate Route Frequency Ordered Stop   08/11/15 1530  amoxicillin (AMOXIL) capsule 500 mg     500 mg Oral 3 times per day 08/11/15 1527     08/09/15 1800  vancomycin (VANCOCIN) 1,500 mg in sodium chloride 0.9 % 500 mL IVPB  Status:  Discontinued     1,500 mg 250 mL/hr over 120 Minutes Intravenous Every 24 hours 08/09/15 1722 08/11/15 1527   08/08/15 1400  cefTRIAXone (ROCEPHIN) injection 1 g  Status:  Discontinued     1 g Intramuscular Every 24 hours 08/07/15 2001 08/07/15 2008   08/08/15 1400  cefTRIAXone (ROCEPHIN) 1 g in dextrose 5 % 50 mL IVPB  Status:  Discontinued     1 g 100 mL/hr over 30 Minutes Intravenous Every 24 hours 08/07/15 2008 08/09/15 1711   08/07/15 1615  cefTRIAXone (ROCEPHIN) injection 1 g     1 g Intramuscular  Once 08/07/15 1604 08/07/15 1625          Objective:   Filed Vitals:   08/10/15 2110 08/10/15 2315 08/11/15 0633 08/11/15 1332  BP: 123/65  128/59 107/44  Pulse: 111 99 95 75  Temp: 98.2 F (36.8 C)  98.3 F (36.8 C) 98.1 F (36.7 C)  TempSrc: Oral  Oral Oral  Resp: 20  20 18   Height:      Weight:      SpO2: 90% 93% 93% 95%    Wt Readings from Last 3 Encounters:  08/07/15 80.1 kg (176 lb 9.4 oz)  07/18/14 75.025 kg (165 lb 6.4 oz)  07/05/14 75.388 kg (166 lb 3.2 oz)     Intake/Output Summary (Last 24 hours) at 08/11/15 1529 Last  data filed at 08/11/15 1230  Gross per 24 hour  Intake    390 ml  Output    900 ml  Net   -510 ml     Physical Exam Awake and communicative today Supple Neck,No JVD,   Symmetrical Chest wall movement, Good air movement bilaterally, no wheezing RRR,No Gallops,Rubs or new Murmurs, No Parasternal Heave +ve B.Sounds, Abd Soft, No tenderness, No organomegaly appriciated, No rebound - guarding or rigidity. No Cyanosis, Clubbing or edema, No new Rash or bruise     Data Review   Micro Results Recent Results (from the past 240 hour(s))  Culture, Urine     Status: None (Preliminary result)   Collection Time: 08/07/15  3:55 PM  Result Value Ref Range Status   Specimen Description URINE, CLEAN CATCH  Final   Special Requests NONE  Final   Culture   Final    >=100,000 COLONIES/mL ENTEROCOCCUS SPECIES Performed at [facility]    Report Status PENDING  Incomplete   Organism ID, Bacteria ENTEROCOCCUS SPECIES  Final      Susceptibility   Enterococcus species - MIC*    AMPICILLIN <=2 SENSITIVE Sensitive     LEVOFLOXACIN 0.5 SENSITIVE Sensitive     NITROFURANTOIN <=16 SENSITIVE Sensitive     VANCOMYCIN 1 SENSITIVE Sensitive     * >=100,000 COLONIES/mL ENTEROCOCCUS SPECIES  Culture, blood (Routine X 2) w Reflex to ID Panel     Status: None (Preliminary result)   Collection Time: 08/09/15  5:27 PM  Result Value Ref Range Status   Specimen Description BLOOD RIGHT HAND  Final   Special Requests   Final    BOTTLES DRAWN AEROBIC AND ANAEROBIC BLUE 10CC RED 3CC   Culture   Final    NO GROWTH 2 DAYS Performed at [facility]    Report Status PENDING  Incomplete  Culture, blood (Routine X 2) w Reflex to ID Panel     Status: None (Preliminary result)   Collection Time: 08/09/15  5:30 PM  Result Value Ref Range Status   Specimen Description BLOOD RIGHT ARM  Final   Special Requests BOTTLES DRAWN AEROBIC AND ANAEROBIC 10CC  Final   Culture   Final    NO GROWTH 2  DAYS Performed at [facility]    Report Status PENDING  Incomplete    Radiology Reports Dg Chest Port 1 View  08/11/2015  CLINICAL  DATA:  Shortness of breath. EXAM: PORTABLE CHEST 1 VIEW COMPARISON:  August 07, 2015 FINDINGS: No pneumothorax. Increasing pulmonary edema. No other interval changes. IMPRESSION: Increasing pulmonary edema. Electronically Signed   By: Dorise Bullion III M.D   On: 08/11/2015 09:21   Dg Chest Port 1 View  08/07/2015  CLINICAL DATA:  Lethargy; fall from bed 1 day prior. Chronic cough. History of prostate carcinoma EXAM: PORTABLE CHEST 1 VIEW COMPARISON:  August 02, 2014 FINDINGS: There is widespread pulmonary fibrotic type change, stable. No frank edema or consolidation. Heart is upper normal in size with pulmonary vascularity within normal limits. No adenopathy. No blastic or lytic bone lesions are apparent. A prior lower thoracic vertebral body fracture is better seen on prior study with lateral view included. IMPRESSION: Widespread pulmonary fibrotic type change, stable. No frank edema or consolidation. No change in cardiac silhouette. Electronically Signed   By: Lowella Grip III M.D.   On: 08/07/2015 17:36     CBC  Recent Labs Lab 08/07/15 1227 08/08/15 0215 08/09/15 0446 08/10/15 0508  WBC 19.7* 12.1* 9.9 9.6  HGB 14.8 12.2* 13.6 13.0  HCT 45.3 37.0* 41.2 38.8*  PLT 179 144* 190 183  MCV 102.0* 101.4* 99.0 97.7  MCH 33.3 33.4 32.7 32.7  MCHC 32.7 33.0 33.0 33.5  RDW 14.3 14.4 13.9 13.7    Chemistries   Recent Labs Lab 08/07/15 1227 08/08/15 0215 08/09/15 0446 08/10/15 0508 08/11/15 0538  NA 139 137 143 141 137  K 3.8 4.1 3.5 2.9* 3.2*  CL 106 108 111 109 106  CO2 23 23 24 23 23   GLUCOSE 145* 156* 123* 123* 135*  BUN 20 24* 18 13 16   CREATININE 0.96 0.96 0.94 0.79 0.76  CALCIUM 8.6* 8.0* 8.4* 8.2* 7.8*  MG  --   --   --  2.0  --   AST  --  24  --   --   --   ALT  --  12*  --   --   --   ALKPHOS  --  40  --   --   --    BILITOT  --  0.9  --   --   --    ------------------------------------------------------------------------------------------------------------------ estimated creatinine clearance is 62.6 mL/min (by C-G formula based on Cr of 0.76). ------------------------------------------------------------------------------------------------------------------ No results for input(s): HGBA1C in the last 72 hours. ------------------------------------------------------------------------------------------------------------------ No results for input(s): CHOL, HDL, LDLCALC, TRIG, CHOLHDL, LDLDIRECT in the last 72 hours. ------------------------------------------------------------------------------------------------------------------ No results for input(s): TSH, T4TOTAL, T3FREE, THYROIDAB in the last 72 hours.  Invalid input(s): FREET3 ------------------------------------------------------------------------------------------------------------------ No results for input(s): VITAMINB12, FOLATE, FERRITIN, TIBC, IRON, RETICCTPCT in the last 72 hours.  Coagulation profile No results for input(s): INR, PROTIME in the last 168 hours.  No results for input(s): DDIMER in the last 72 hours.  Cardiac Enzymes  Recent Labs Lab 08/07/15 2050 08/08/15 0215 08/08/15 0750  TROPONINI 0.03 <0.03 <0.03   ------------------------------------------------------------------------------------------------------------------ Invalid input(s): POCBNP     Time Spent in minutes   20 minutes   ELGERGAWY, DAWOOD M.D on 08/11/2015 at 3:29 PM  Between 7am to 7pm - Pager - [phone]  After 7pm go to [email or web address] - password [facility]  [facility]   Office  [phone]

## 2015-08-11 NOTE — Progress Notes (Signed)
This morning patient started coughing up a moderate amount of bloody sputum. Patient needed to be on 1L of oxygen last night to keep his oxygen sat. Up aboce 90%. No complaints of shortness of breath or chest pain. On call made aware.

## 2015-08-12 ENCOUNTER — Inpatient Hospital Stay (HOSPITAL_COMMUNITY): Payer: Medicare Other

## 2015-08-12 LAB — BASIC METABOLIC PANEL
Anion gap: 8 (ref 5–15)
BUN: 19 mg/dL (ref 6–20)
CALCIUM: 8.3 mg/dL — AB (ref 8.9–10.3)
CHLORIDE: 104 mmol/L (ref 101–111)
CO2: 29 mmol/L (ref 22–32)
CREATININE: 0.82 mg/dL (ref 0.61–1.24)
GFR calc Af Amer: >60 mL/min (ref >60)
GFR calc non Af Amer: >60 mL/min (ref >60)
GLUCOSE: 147 mg/dL — AB (ref 65–99)
Potassium: 3.5 mmol/L (ref 3.5–5.1)
Sodium: 141 mmol/L (ref 135–145)

## 2015-08-12 LAB — BRAIN NATRIURETIC PEPTIDE: B Natriuretic Peptide: 104 pg/mL — ABNORMAL HIGH (ref 0.0–100.0)

## 2015-08-12 MED ORDER — POLYETHYLENE GLYCOL 3350 17 G PO PACK
17.0000 g | PACK | Freq: Once | ORAL | Status: AC
  Administered 2015-08-12: 17 g via ORAL
  Filled 2015-08-12: qty 1

## 2015-08-12 MED ORDER — BISACODYL 5 MG PO TBEC: 5.0000 mg | DELAYED_RELEASE_TABLET | Freq: Every day | ORAL | Status: DC | PRN

## 2015-08-12 MED ORDER — FUROSEMIDE 10 MG/ML IJ SOLN
40.0000 mg | Freq: Two times a day (BID) | INTRAMUSCULAR | Status: DC
  Administered 2015-08-12 – 2015-08-13 (×3): 40 mg via INTRAVENOUS
  Filled 2015-08-12 (×4): qty 4

## 2015-08-12 MED ORDER — BISACODYL 10 MG RE SUPP
10.0000 mg | Freq: Two times a day (BID) | RECTAL | Status: AC
  Administered 2015-08-12: 10 mg via RECTAL
  Filled 2015-08-12: qty 1

## 2015-08-12 MED ORDER — BISACODYL 5 MG PO TBEC
10.0000 mg | DELAYED_RELEASE_TABLET | Freq: Once | ORAL | Status: AC
  Administered 2015-08-12: 10 mg via ORAL
  Filled 2015-08-12: qty 2

## 2015-08-12 NOTE — Progress Notes (Signed)
Patient Demographics  Derrick Andrews, is a 82 y.o. male, DOB - 1918/04/07, [id number]  Admit date - 08/07/2015   Admitting Physician Bonnielee Haff, MD  Outpatient Primary MD for the patient is Horton Finer, MD  LOS - 5   Chief Complaint  Patient presents with  . Hypotension  . Weakness         Subjective:   Derrick Andrews today denies any chest pain, shortness of breath, afebrile.   Assessment & Plan    Principal Problem:   UTI (urinary tract infection) Active Problems:   Cancer, bladder, neck (HCC)   Temporal arteritis (HCC)   Coronary artery disease   Cough   UTI (lower urinary tract infection)  UTI  -Initially on IV Rocephin, urine culture growing  Enterococcus,changed IV Rocephin to IV vancomycin on 3/2 , sensitivity  showing ampicillin sensitive, so  IV vancomycin narrowed to amoxicillin   Hypotension - Dehydration and volume depletion, blood pressure is improving, continue gentle hydration, continue to hold Imdur - Initially on steroid stress dose hydrocortisone, tapered back to home dose steroid   History of temporal arteritis - On chronic steroids, tapered steroids down to his home dose  Cough , hypoxia  - X-ray with evidence of volume overload noted on IV diuresis, repeat x-ray this a.m. showing severe diffuse bilateral pulmonary edema, so will increase IV diuresis, will check 2-D echo to evaluate for possible CHF.  History of coronary artery disease - Denies any chest pain or shortness of breath - Troponins negative 3, EKG nonacute - Continue with aspirin  Hyperlipidemia  - Continue with statin   Hypokalemia - repleted,   Acute delerium - Mr. currently improved, continue with when necessary Haldol, discontinued Seroquel  Code Status: Full code  Family Communication: discussed with daughter at bedside  Disposition Plan: SNF when medically  stable.   Procedures  None   Consults  None   Medications  Scheduled Meds: . amoxicillin  500 mg Oral 3 times per day  . aspirin EC  81 mg Oral Daily  . bisacodyl  10 mg Rectal BID  . docusate sodium  100 mg Oral BID  . enoxaparin (LOVENOX) injection  40 mg Subcutaneous Q24H  . furosemide  40 mg Intravenous BID  . guaiFENesin  1,200 mg Oral BID  . oxybutynin  5 mg Oral BID  . predniSONE  2 mg Oral Q breakfast  . sodium chloride flush  3 mL Intravenous Q12H   Continuous Infusions:   PRN Meds:.acetaminophen **OR** acetaminophen, albuterol, bisacodyl, haloperidol lactate, ondansetron **OR** ondansetron (ZOFRAN) IV  DVT Prophylaxis  Lovenox   Lab Results  Component Value Date   PLT 183 08/10/2015    Antibiotics    Anti-infectives    Start     Dose/Rate Route Frequency Ordered Stop   08/11/15 1600  amoxicillin (AMOXIL) capsule 500 mg     500 mg Oral 3 times per day 08/11/15 1527     08/09/15 1800  vancomycin (VANCOCIN) 1,500 mg in sodium chloride 0.9 % 500 mL IVPB  Status:  Discontinued     1,500 mg 250 mL/hr over 120 Minutes Intravenous Every 24 hours 08/09/15 1722 08/11/15 1527   08/08/15 1400  cefTRIAXone (ROCEPHIN) injection 1 g  Status:  Discontinued     1 g Intramuscular Every 24 hours 08/07/15 2001 08/07/15 2008   08/08/15 1400  cefTRIAXone (ROCEPHIN) 1 g in dextrose 5 % 50 mL IVPB  Status:  Discontinued     1 g 100 mL/hr over 30 Minutes Intravenous Every 24 hours 08/07/15 2008 08/09/15 1711   08/07/15 1615  cefTRIAXone (ROCEPHIN) injection 1 g     1 g Intramuscular  Once 08/07/15 1604 08/07/15 1625          Objective:   Filed Vitals:   08/11/15 2146 08/11/15 2153 08/12/15 0557 08/12/15 1344  BP: 180/89 160/74 132/56 97/48  Pulse: 101 97 88 85  Temp: 98.1 F (36.7 C)  98.4 F (36.9 C) 97.9 F (36.6 C)  TempSrc: Oral  Oral Oral  Resp:  22  20  Height:      Weight:      SpO2: 91% 94% 93% 91%    Wt Readings from Last 3 Encounters:  08/07/15  80.1 kg (176 lb 9.4 oz)  07/18/14 75.025 kg (165 lb 6.4 oz)  07/05/14 75.388 kg (166 lb 3.2 oz)     Intake/Output Summary (Last 24 hours) at 08/12/15 1610 Last data filed at 08/12/15 1342  Gross per 24 hour  Intake    720 ml  Output   1826 ml  Net  -1106 ml     Physical Exam Awake and communicative today Supple Neck,No JVD,   Symmetrical Chest wall movement, Good air movement bilaterally, no wheezing RRR,No Gallops,Rubs or new Murmurs, No Parasternal Heave +ve B.Sounds, Abd Soft, No tenderness, No organomegaly appriciated, No rebound - guarding or rigidity. No Cyanosis, Clubbing or edema, No new Rash or bruise     Data Review   Micro Results Recent Results (from the past 240 hour(s))  Culture, Urine     Status: None (Preliminary result)   Collection Time: 08/07/15  3:55 PM  Result Value Ref Range Status   Specimen Description URINE, CLEAN CATCH  Final   Special Requests NONE  Final   Culture >=100,000 COLONIES/mL ENTEROCOCCUS SPECIES  Final   Report Status PENDING  Incomplete   Organism ID, Bacteria ENTEROCOCCUS SPECIES  Final      Susceptibility   Enterococcus species - MIC*    AMPICILLIN <=2 SENSITIVE Sensitive     LEVOFLOXACIN 0.5 SENSITIVE Sensitive     NITROFURANTOIN <=16 SENSITIVE Sensitive     VANCOMYCIN Value in next row Sensitive      1 SENSITIVEPerformed at [facility]    * >=100,000 COLONIES/mL ENTEROCOCCUS SPECIES  Culture, blood (Routine X 2) w Reflex to ID Panel     Status: None (Preliminary result)   Collection Time: 08/09/15  5:27 PM  Result Value Ref Range Status   Specimen Description BLOOD RIGHT HAND  Final   Special Requests   Final    BOTTLES DRAWN AEROBIC AND ANAEROBIC BLUE 10CC RED 3CC   Culture   Final    NO GROWTH 3 DAYS Performed at [facility]    Report Status PENDING  Incomplete  Culture, blood (Routine X 2) w Reflex to ID Panel     Status: None (Preliminary result)   Collection Time: 08/09/15  5:30 PM  Result Value  Ref Range Status   Specimen Description BLOOD RIGHT ARM  Final   Special Requests BOTTLES DRAWN AEROBIC AND ANAEROBIC 10CC  Final   Culture   Final    NO GROWTH 3 DAYS Performed at [facility]  Report Status PENDING  Incomplete    Radiology Reports Dg Chest Port 1 View  08/12/2015  CLINICAL DATA:  Cough, prostate cancer, former smoker EXAM: PORTABLE CHEST 1 VIEW COMPARISON:  Portable exam [id number] hours compared 08/11/2015 FINDINGS: Stable heart size and mediastinal contours. Diffuse BILATERAL pulmonary infiltrates likely representing pulmonary edema though infection not entirely excluded. No gross pleural effusion or pneumothorax. Bones demineralized. IMPRESSION: Severe diffuse BILATERAL pulmonary infiltrates favoring pulmonary edema, little changed. Electronically Signed   By: Lavonia Dana M.D.   On: 08/12/2015 07:13   Dg Chest Port 1 View  08/11/2015  CLINICAL DATA:  Shortness of breath. EXAM: PORTABLE CHEST 1 VIEW COMPARISON:  August 07, 2015 FINDINGS: No pneumothorax. Increasing pulmonary edema. No other interval changes. IMPRESSION: Increasing pulmonary edema. Electronically Signed   By: Dorise Bullion III M.D   On: 08/11/2015 09:21   Dg Chest Port 1 View  08/07/2015  CLINICAL DATA:  Lethargy; fall from bed 1 day prior. Chronic cough. History of prostate carcinoma EXAM: PORTABLE CHEST 1 VIEW COMPARISON:  August 02, 2014 FINDINGS: There is widespread pulmonary fibrotic type change, stable. No frank edema or consolidation. Heart is upper normal in size with pulmonary vascularity within normal limits. No adenopathy. No blastic or lytic bone lesions are apparent. A prior lower thoracic vertebral body fracture is better seen on prior study with lateral view included. IMPRESSION: Widespread pulmonary fibrotic type change, stable. No frank edema or consolidation. No change in cardiac silhouette. Electronically Signed   By: Lowella Grip III M.D.   On: 08/07/2015 17:36      CBC  Recent Labs Lab 08/07/15 1227 08/08/15 0215 08/09/15 0446 08/10/15 0508  WBC 19.7* 12.1* 9.9 9.6  HGB 14.8 12.2* 13.6 13.0  HCT 45.3 37.0* 41.2 38.8*  PLT 179 144* 190 183  MCV 102.0* 101.4* 99.0 97.7  MCH 33.3 33.4 32.7 32.7  MCHC 32.7 33.0 33.0 33.5  RDW 14.3 14.4 13.9 13.7    Chemistries   Recent Labs Lab 08/08/15 0215 08/09/15 0446 08/10/15 0508 08/11/15 0538 08/12/15 0841  NA 137 143 141 137 141  K 4.1 3.5 2.9* 3.2* 3.5  CL 108 111 109 106 104  CO2 23 24 23 23 29   GLUCOSE 156* 123* 123* 135* 147*  BUN 24* 18 13 16 19   CREATININE 0.96 0.94 0.79 0.76 0.82  CALCIUM 8.0* 8.4* 8.2* 7.8* 8.3*  MG  --   --  2.0  --   --   AST 24  --   --   --   --   ALT 12*  --   --   --   --   ALKPHOS 40  --   --   --   --   BILITOT 0.9  --   --   --   --    ------------------------------------------------------------------------------------------------------------------ estimated creatinine clearance is 61.1 mL/min (by C-G formula based on Cr of 0.82). ------------------------------------------------------------------------------------------------------------------ No results for input(s): HGBA1C in the last 72 hours. ------------------------------------------------------------------------------------------------------------------ No results for input(s): CHOL, HDL, LDLCALC, TRIG, CHOLHDL, LDLDIRECT in the last 72 hours. ------------------------------------------------------------------------------------------------------------------ No results for input(s): TSH, T4TOTAL, T3FREE, THYROIDAB in the last 72 hours.  Invalid input(s): FREET3 ------------------------------------------------------------------------------------------------------------------ No results for input(s): VITAMINB12, FOLATE, FERRITIN, TIBC, IRON, RETICCTPCT in the last 72 hours.  Coagulation profile No results for input(s): INR, PROTIME in the last 168 hours.  No results for input(s): DDIMER in the  last 72 hours.  Cardiac Enzymes  Recent Labs Lab 08/07/15 2050 08/08/15 0215 08/08/15  0750  TROPONINI 0.03 <0.03 <0.03   ------------------------------------------------------------------------------------------------------------------ Invalid input(s): POCBNP     Time Spent in minutes   20 minutes   Mekiah Wahler M.D on 08/12/2015 at 4:10 PM  Between 7am to 7pm - Pager - [phone]  After 7pm go to [email or web address] - password [facility]  [facility]   Office  [phone]

## 2015-08-12 NOTE — Clinical Social Work Note (Signed)
CSW was informed by physician that patient is not medically ready for discharge today, CSW updated [facility] SNF and patient's daughter Manuela Schwartz at [phone]

## 2015-08-13 ENCOUNTER — Inpatient Hospital Stay (HOSPITAL_COMMUNITY): Payer: Medicare Other

## 2015-08-13 DIAGNOSIS — J810 Acute pulmonary edema: Secondary | ICD-10-CM

## 2015-08-13 DIAGNOSIS — R0600 Dyspnea, unspecified: Secondary | ICD-10-CM

## 2015-08-13 LAB — BASIC METABOLIC PANEL
Anion gap: 7 (ref 5–15)
BUN: 21 mg/dL — AB (ref 6–20)
CHLORIDE: 100 mmol/L — AB (ref 101–111)
CO2: 30 mmol/L (ref 22–32)
Calcium: 8 mg/dL — ABNORMAL LOW (ref 8.9–10.3)
Creatinine, Ser: 0.76 mg/dL (ref 0.61–1.24)
GFR calc Af Amer: >60 mL/min (ref >60)
GFR calc non Af Amer: >60 mL/min (ref >60)
GLUCOSE: 118 mg/dL — AB (ref 65–99)
POTASSIUM: 3.5 mmol/L (ref 3.5–5.1)
Sodium: 137 mmol/L (ref 135–145)

## 2015-08-13 MED ORDER — POTASSIUM CHLORIDE CRYS ER 20 MEQ PO TBCR
40.0000 meq | EXTENDED_RELEASE_TABLET | Freq: Once | ORAL | Status: AC
  Administered 2015-08-13: 40 meq via ORAL
  Filled 2015-08-13: qty 2

## 2015-08-13 MED ORDER — MAGNESIUM HYDROXIDE 400 MG/5ML PO SUSP
60.0000 mL | Freq: Once | ORAL | Status: AC
  Administered 2015-08-13: 60 mL via ORAL
  Filled 2015-08-13: qty 60

## 2015-08-13 MED ORDER — SENNOSIDES-DOCUSATE SODIUM 8.6-50 MG PO TABS
3.0000 | ORAL_TABLET | Freq: Once | ORAL | Status: DC
  Filled 2015-08-13: qty 3

## 2015-08-13 MED ORDER — MAGNESIUM CITRATE PO SOLN
0.5000 | Freq: Once | ORAL | Status: AC
  Administered 2015-08-13: 0.5 via ORAL

## 2015-08-13 MED ORDER — FUROSEMIDE 40 MG PO TABS
40.0000 mg | ORAL_TABLET | Freq: Every day | ORAL | Status: DC
  Administered 2015-08-14: 40 mg via ORAL
  Filled 2015-08-13: qty 1

## 2015-08-13 MED ORDER — BISACODYL 5 MG PO TBEC
10.0000 mg | DELAYED_RELEASE_TABLET | Freq: Once | ORAL | Status: DC
  Filled 2015-08-13: qty 2

## 2015-08-13 NOTE — Progress Notes (Signed)
Patient Demographics  Derrick Andrews, is a 82 y.o. male, DOB - [birth year], [id number]  Admit date - 08/07/2015   Admitting Physician Bonnielee Haff, MD  Outpatient Primary MD for the patient is Horton Finer, MD  LOS - 6   Chief Complaint  Patient presents with  . Hypotension  . Weakness         Subjective:   [name] today denies any chest pain, shortness of breath, afebrile.   Assessment & Plan    Principal Problem:   UTI (urinary tract infection) Active Problems:   Cancer, bladder, neck (HCC)   Temporal arteritis (HCC)   Coronary artery disease   Cough   UTI (lower urinary tract infection)  UTI  -Initially on IV Rocephin, urine culture growing  Enterococcus,changed IV Rocephin to IV vancomycin on 3/2 , sensitivity  showing ampicillin sensitive, so  IV vancomycin narrowed to amoxicillin   Hypotension - Dehydration and volume depletion, blood pressure is improving, continue gentle hydration, continue to hold Imdur - Initially on steroid stress dose hydrocortisone, tapered back to home dose steroid   History of temporal arteritis - On chronic steroids, tapered steroids down to his home dose  Cough , hypoxia  - X-ray with evidence of volume overload noted, started on IV diuresis, chest x-ray this a.m. so a significant improvement, we'll change to by mouth diuresis , 2-D echo pending to evaluate for CHF as a cause of his pulmonary edema .  History of coronary artery disease - Denies any chest pain or shortness of breath - Troponins negative 3, EKG nonacute - Continue with aspirin  Hyperlipidemia   - Continue with statin   Hypokalemia - repleted,   Acute delerium - Mr. currently improved, continue with when necessary Haldol, discontinued Seroquel  Code Status: Full code  Family Communication: discussed with daughter at bedside  Disposition Plan: SNF when  medically stable.   Procedures  None   Consults  None   Medications  Scheduled Meds: . amoxicillin  500 mg Oral 3 times per day  . aspirin EC  81 mg Oral Daily  . bisacodyl  10 mg Oral Once  . bisacodyl  10 mg Rectal BID  . docusate sodium  100 mg Oral BID  . enoxaparin (LOVENOX) injection  40 mg Subcutaneous Q24H  . furosemide  40 mg Oral Daily  . guaiFENesin  1,200 mg Oral BID  . oxybutynin  5 mg Oral BID  . predniSONE  2 mg Oral Q breakfast  . senna-docusate  3 tablet Oral Once  . sodium chloride flush  3 mL Intravenous Q12H   Continuous Infusions:   PRN Meds:.acetaminophen **OR** acetaminophen, albuterol, bisacodyl, haloperidol lactate, ondansetron **OR** ondansetron (ZOFRAN) IV  DVT Prophylaxis  Lovenox   Lab Results  Component Value Date   PLT 183 08/10/2015    Antibiotics    Anti-infectives    Start     Dose/Rate Route Frequency Ordered Stop   08/11/15 1600  amoxicillin (AMOXIL) capsule 500 mg     500 mg Oral 3 times per day 08/11/15 1527     08/09/15 1800  vancomycin (VANCOCIN) 1,500 mg in sodium chloride 0.9 % 500 mL IVPB  Status:  Discontinued     1,500 mg 250 mL/hr  over 120 Minutes Intravenous Every 24 hours 08/09/15 1722 08/11/15 1527   08/08/15 1400  cefTRIAXone (ROCEPHIN) injection 1 g  Status:  Discontinued     1 g Intramuscular Every 24 hours 08/07/15 2001 08/07/15 2008   08/08/15 1400  cefTRIAXone (ROCEPHIN) 1 g in dextrose 5 % 50 mL IVPB  Status:  Discontinued     1 g 100 mL/hr over 30 Minutes Intravenous Every 24 hours 08/07/15 2008 08/09/15 1711   08/07/15 1615  cefTRIAXone (ROCEPHIN) injection 1 g     1 g Intramuscular  Once 08/07/15 1604 08/07/15 1625          Objective:   Filed Vitals:   08/13/15 0541 08/13/15 0830 08/13/15 0933 08/13/15 1458  BP: 128/65  117/53 114/50  Pulse: 85  82 82  Temp: 98.1 F (36.7 C)   98.2 F (36.8 C)  TempSrc: Oral   Axillary  Resp: 16   17  Height:      Weight:  79.8 kg (175 lb 14.8 oz)      SpO2: 94%   95%    Wt Readings from Last 3 Encounters:  08/13/15 79.8 kg (175 lb 14.8 oz)  07/18/14 75.025 kg (165 lb 6.4 oz)  07/05/14 75.388 kg (166 lb 3.2 oz)     Intake/Output Summary (Last 24 hours) at 08/13/15 1652 Last data filed at 08/13/15 1125  Gross per 24 hour  Intake    600 ml  Output   2100 ml  Net  -1500 ml     Physical Exam Awake and communicative today Supple Neck,No JVD,   Symmetrical Chest wall movement, Good air movement bilaterally, no wheezing RRR,No Gallops,Rubs or new Murmurs, No Parasternal Heave +ve B.Sounds, Abd Soft, No tenderness, No organomegaly appriciated, No rebound - guarding or rigidity. No Cyanosis, Clubbing or edema, No new Rash or bruise     Data Review   Micro Results Recent Results (from the past 240 hour(s))  Culture, Urine     Status: None (Preliminary result)   Collection Time: 08/07/15  3:55 PM  Result Value Ref Range Status   Specimen Description URINE, CLEAN CATCH  Final   Special Requests NONE  Final   Culture   Final    >=100,000 COLONIES/mL ENTEROCOCCUS SPECIES HOLDING FOR POSSIBLE SECOND ORGANISM SENDING TO REFERENCE LAB TO CONFIRM Performed at [facility]    Report Status PENDING  Incomplete   Organism ID, Bacteria ENTEROCOCCUS SPECIES  Final      Susceptibility   Enterococcus species - MIC*    AMPICILLIN <=2 SENSITIVE Sensitive     LEVOFLOXACIN 0.5 SENSITIVE Sensitive     NITROFURANTOIN <=16 SENSITIVE Sensitive     VANCOMYCIN 1 SENSITIVE Sensitive     * >=100,000 COLONIES/mL ENTEROCOCCUS SPECIES  Culture, blood (Routine X 2) w Reflex to ID Panel     Status: None (Preliminary result)   Collection Time: 08/09/15  5:27 PM  Result Value Ref Range Status   Specimen Description BLOOD RIGHT HAND  Final   Special Requests   Final    BOTTLES DRAWN AEROBIC AND ANAEROBIC BLUE 10CC RED 3CC   Culture   Final    NO GROWTH 4 DAYS Performed at [facility]    Report Status PENDING  Incomplete  Culture,  blood (Routine X 2) w Reflex to ID Panel     Status: None (Preliminary result)   Collection Time: 08/09/15  5:30 PM  Result Value Ref Range Status   Specimen Description BLOOD  RIGHT ARM  Final   Special Requests BOTTLES DRAWN AEROBIC AND ANAEROBIC 10CC  Final   Culture   Final    NO GROWTH 4 DAYS Performed at [facility]    Report Status PENDING  Incomplete    Radiology Reports Dg Chest Port 1 View  08/13/2015  CLINICAL DATA:  82 year old male with cough and shortness of Breath. Initial encounter. EXAM: PORTABLE CHEST 1 VIEW COMPARISON:  08/12/2015 and earlier. FINDINGS: Widespread reticulonodular opacity in both lungs superimposed on basilar predominant chronic interstitial changes. Improved lung volumes since yesterday, still somewhat low compared to baseline. No pneumothorax or pleural effusion. Mediastinal contours remain within normal limits. Since 08/11/2015 ventilation bilaterally has improved. IMPRESSION: Mildly improved ventilation since 08/11/2015. Appearance could reflect regression of bilateral pneumonia or pulmonary edema superimposed on chronic lung disease. Electronically Signed   By: Genevie Ann M.D.   On: 08/13/2015 07:16   Dg Chest Port 1 View  08/12/2015  CLINICAL DATA:  Cough, prostate cancer, former smoker EXAM: PORTABLE CHEST 1 VIEW COMPARISON:  Portable exam [id number] hours compared 08/11/2015 FINDINGS: Stable heart size and mediastinal contours. Diffuse BILATERAL pulmonary infiltrates likely representing pulmonary edema though infection not entirely excluded. No gross pleural effusion or pneumothorax. Bones demineralized. IMPRESSION: Severe diffuse BILATERAL pulmonary infiltrates favoring pulmonary edema, little changed. Electronically Signed   By: Lavonia Dana M.D.   On: 08/12/2015 07:13   Dg Chest Port 1 View  08/11/2015  CLINICAL DATA:  Shortness of breath. EXAM: PORTABLE CHEST 1 VIEW COMPARISON:  August 07, 2015 FINDINGS: No pneumothorax. Increasing pulmonary edema. No  other interval changes. IMPRESSION: Increasing pulmonary edema. Electronically Signed   By: Dorise Bullion III M.D   On: 08/11/2015 09:21   Dg Chest Port 1 View  08/07/2015  CLINICAL DATA:  Lethargy; fall from bed 1 day prior. Chronic cough. History of prostate carcinoma EXAM: PORTABLE CHEST 1 VIEW COMPARISON:  August 02, 2014 FINDINGS: There is widespread pulmonary fibrotic type change, stable. No frank edema or consolidation. Heart is upper normal in size with pulmonary vascularity within normal limits. No adenopathy. No blastic or lytic bone lesions are apparent. A prior lower thoracic vertebral body fracture is better seen on prior study with lateral view included. IMPRESSION: Widespread pulmonary fibrotic type change, stable. No frank edema or consolidation. No change in cardiac silhouette. Electronically Signed   By: Lowella Grip III M.D.   On: 08/07/2015 17:36     CBC  Recent Labs Lab 08/07/15 1227 08/08/15 0215 08/09/15 0446 08/10/15 0508  WBC 19.7* 12.1* 9.9 9.6  HGB 14.8 12.2* 13.6 13.0  HCT 45.3 37.0* 41.2 38.8*  PLT 179 144* 190 183  MCV 102.0* 101.4* 99.0 97.7  MCH 33.3 33.4 32.7 32.7  MCHC 32.7 33.0 33.0 33.5  RDW 14.3 14.4 13.9 13.7    Chemistries   Recent Labs Lab 08/08/15 0215 08/09/15 0446 08/10/15 0508 08/11/15 0538 08/12/15 0841 08/13/15 0440  NA 137 143 141 137 141 137  K 4.1 3.5 2.9* 3.2* 3.5 3.5  CL 108 111 109 106 104 100*  CO2 23 24 23 23 29 30   GLUCOSE 156* 123* 123* 135* 147* 118*  BUN 24* 18 13 16 19  21*  CREATININE 0.96 0.94 0.79 0.76 0.82 0.76  CALCIUM 8.0* 8.4* 8.2* 7.8* 8.3* 8.0*  MG  --   --  2.0  --   --   --   AST 24  --   --   --   --   --  ALT 12*  --   --   --   --   --   ALKPHOS 40  --   --   --   --   --   BILITOT 0.9  --   --   --   --   --    ------------------------------------------------------------------------------------------------------------------ estimated creatinine clearance is 62.3 mL/min (by C-G formula  based on Cr of 0.76). ------------------------------------------------------------------------------------------------------------------ No results for input(s): HGBA1C in the last 72 hours. ------------------------------------------------------------------------------------------------------------------ No results for input(s): CHOL, HDL, LDLCALC, TRIG, CHOLHDL, LDLDIRECT in the last 72 hours. ------------------------------------------------------------------------------------------------------------------ No results for input(s): TSH, T4TOTAL, T3FREE, THYROIDAB in the last 72 hours.  Invalid input(s): FREET3 ------------------------------------------------------------------------------------------------------------------ No results for input(s): VITAMINB12, FOLATE, FERRITIN, TIBC, IRON, RETICCTPCT in the last 72 hours.  Coagulation profile No results for input(s): INR, PROTIME in the last 168 hours.  No results for input(s): DDIMER in the last 72 hours.  Cardiac Enzymes  Recent Labs Lab 08/07/15 2050 08/08/15 0215 08/08/15 0750  TROPONINI 0.03 <0.03 <0.03   ------------------------------------------------------------------------------------------------------------------ Invalid input(s): POCBNP     Time Spent in minutes   20 minutes   Roux Brandy M.D on 08/13/2015 at 4:52 PM  Between 7am to 7pm - Pager - [phone]  After 7pm go to [email or web address] - password [email or web address]  [facility]   Office  [phone]

## 2015-08-13 NOTE — Progress Notes (Signed)
Physical Therapy Treatment Patient Details Name: Derrick Andrews MRN: [id number] DOB: Nov 21, 1917 Today's Date: 09/09/2015    History of Present Illness 82 yo male  admitted for weakness, recent falls.  H/O CAD and bladder CA    PT Comments    Pt progressing slowly, continue to recommend SNF; pt is cooperative with encouragement to participate  Follow Up Recommendations  SNF;Supervision/Assistance - 24 hour     Equipment Recommendations  None recommended by PT    Recommendations for Other Services       Precautions / Restrictions Precautions Precautions: Fall Restrictions Weight Bearing Restrictions: No    Mobility  Bed Mobility Overal bed mobility: Needs Assistance Bed Mobility: Supine to Sit     Supine to sit: Mod assist     General bed mobility comments: assist with LEs and trunk  Transfers Overall transfer level: Needs assistance Equipment used: Rolling walker (2 wheeled);None Transfers: Sit to/from [facility] Sit to Stand: Mod assist;From elevated surface Stand pivot transfers: Mod assist;From elevated surface       General transfer comment: repeated x2;assist to wt shift to stand and pivot, cues for sequencing, hand placement, safety  Ambulation/Gait                 Stairs            Wheelchair Mobility    Modified Rankin (Stroke Patients Only)       Balance                                    Cognition   Behavior During Therapy: WFL for tasks assessed/performed Overall Cognitive Status: Impaired/Different from baseline Area of Impairment: Orientation;Following commands;Awareness Orientation Level: Disoriented to;Place;Situation   Memory: Decreased short-term memory Following Commands: Follows one step commands with increased time       General Comments: less confused overall today than last session    Exercises      General Comments        Pertinent Vitals/Pain Pain Assessment:  No/denies pain    Home Living                      Prior Function            PT Goals (current goals can now be found in the care plan section) Acute Rehab PT Goals Patient Stated Goal: home PT Goal Formulation: With patient Time For Goal Achievement: 08/22/15 Potential to Achieve Goals: Good Progress towards PT goals: Progressing toward goals    Frequency  Min 3X/week    PT Plan Current plan remains appropriate    Co-evaluation             End of Session Equipment Utilized During Treatment: Gait belt Activity Tolerance: Patient tolerated treatment well Patient left: in chair;with call bell/phone within reach;with family/visitor present;with chair alarm set     Time: [id number] PT Time Calculation (min) (ACUTE ONLY): 14 min  Charges:  $Therapeutic Activity: 8-22 mins                    G Codes:      Emir Nack 09/09/15, 12:57 PM

## 2015-08-13 NOTE — Progress Notes (Signed)
Echocardiogram 2D Echocardiogram has been performed.  Derrick Andrews 08/13/2015, 1:12 PM

## 2015-08-13 NOTE — Progress Notes (Signed)
CSW continuing to follow.   CSW spoke with MD who states awaiting ECHO results before pt discharges to [facility] and [facility].   CSW updated pt daughter, Manuela Schwartz.  CSW updated [employer] and pt insurance, [employer].   CSW to continue to follow for pt discharge to [facility] when medically ready.  Alison Murray, MSW, [place] [facility] [phone]

## 2015-08-14 DIAGNOSIS — I5031 Acute diastolic (congestive) heart failure: Secondary | ICD-10-CM

## 2015-08-14 LAB — CULTURE, BLOOD (ROUTINE X 2)
CULTURE: NO GROWTH
Culture: NO GROWTH

## 2015-08-14 MED ORDER — SACCHAROMYCES BOULARDII 250 MG PO CAPS: 250.0000 mg | ORAL_CAPSULE | Freq: Two times a day (BID) | ORAL | Status: AC

## 2015-08-14 MED ORDER — FUROSEMIDE 40 MG PO TABS: 20.0000 mg | ORAL_TABLET | Freq: Every day | ORAL | Status: AC

## 2015-08-14 MED ORDER — BISACODYL 5 MG PO TBEC: 5.0000 mg | DELAYED_RELEASE_TABLET | Freq: Every day | ORAL | Status: AC | PRN

## 2015-08-14 MED ORDER — AMOXICILLIN 500 MG PO CAPS: 500.0000 mg | ORAL_CAPSULE | Freq: Three times a day (TID) | ORAL | Status: AC

## 2015-08-14 MED ORDER — ISOSORBIDE MONONITRATE ER 60 MG PO TB24: 60.0000 mg | ORAL_TABLET | Freq: Every day | ORAL | Status: AC

## 2015-08-14 MED ORDER — DOCUSATE SODIUM 100 MG PO CAPS: 100.0000 mg | ORAL_CAPSULE | Freq: Two times a day (BID) | ORAL | Status: AC

## 2015-08-14 MED ORDER — MAGNESIUM HYDROXIDE 400 MG/5ML PO SUSP: 30.0000 mL | Freq: Every day | ORAL | Status: AC | PRN

## 2015-08-14 MED ORDER — SENNOSIDES-DOCUSATE SODIUM 8.6-50 MG PO TABS: 3.0000 | ORAL_TABLET | Freq: Every day | ORAL | Status: AC

## 2015-08-14 MED ORDER — ALBUTEROL SULFATE (2.5 MG/3ML) 0.083% IN NEBU: 2.5000 mg | INHALATION_SOLUTION | Freq: Four times a day (QID) | RESPIRATORY_TRACT | Status: AC | PRN

## 2015-08-14 NOTE — Care Management Important Message (Signed)
Important Message  Patient Details  Name: Derrick Andrews MRN: [id number] Date of Birth: [birth year]   Medicare Important Message Given:  Yes    Camillo Flaming 08/14/2015, 2:30 [place] Message  Patient Details  Name: Derrick Andrews MRN: [id number] Date of Birth: Apr 28, 1918   Medicare Important Message Given:  Yes    Camillo Flaming 08/14/2015, 2:30 PM

## 2015-08-14 NOTE — Clinical Social Work Placement (Signed)
   CLINICAL SOCIAL WORK PLACEMENT  NOTE  Date:  08/14/2015  Patient Details  Name: Derrick Andrews MRN: [id number] Date of Birth: [birth year]  Clinical Social Work is seeking post-discharge placement for this patient at the [place] level of care (*CSW will initial, date and re-position this form in  chart as items are completed):  Yes   Patient/family provided with [facility] Department's list of facilities offering this level of care within the geographic area requested by the patient (or if unable, by the patient's family).  Yes   Patient/family informed of their freedom to choose among providers that offer the needed level of care, that participate in Medicare, Medicaid or managed care program needed by the patient, have an available bed and are willing to accept the patient.  Yes   Patient/family informed of [facility]'s ownership interest in [facility] and [facility], as well as of the fact that they are under no obligation to receive care at these facilities.  PASRR submitted to EDS on 08/09/15     PASRR number received on 08/09/15     Existing PASRR number confirmed on       FL2 transmitted to all facilities in geographic area requested by pt/family on 08/09/15     FL2 transmitted to all facilities within larger geographic area on       Patient informed that his/her managed care company has contracts with or will negotiate with certain facilities, including the following:        Yes   Patient/family informed of bed offers received.  Patient chooses bed at [facility]     Physician recommends and patient chooses bed at      Patient to be transferred to [facility] on 08/14/15.  Patient to be transferred to facility by ambulance Corey Harold)     Patient family notified on 08/14/15 of transfer.  Name of family member notified:  pt daughter, Manuela Schwartz     PHYSICIAN Please sign FL2     Additional Comment:     _______________________________________________ Ladell Pier, LCSW 08/14/2015, 1:42 PM

## 2015-08-14 NOTE — Discharge Instructions (Signed)
Follow with Primary MD Horton Finer, MD in 7 days   Get CBC, CMP, 2 view Chest X ray checked  by Primary MD next visit.    Activity: As tolerated with Full fall precautions use walker/cane & assistance as needed   Disposition SNF   Diet: Heart Healthy  , with feeding assistance and aspiration precautions.  For Heart failure patients - Check your Weight same time everyday, if you gain over 2 pounds, or you develop in leg swelling, experience more shortness of breath or chest pain, call your Primary MD immediately. Follow Cardiac Low Salt Diet and 1.5 lit/day fluid restriction.   On your next visit with your primary care physician please Get Medicines reviewed and adjusted.   Please request your Prim.MD to go over all Hospital Tests and Procedure/Radiological results at the follow up, please get all Hospital records sent to your Prim MD by signing hospital release before you go home.   If you experience worsening of your admission symptoms, develop shortness of breath, life threatening emergency, suicidal or homicidal thoughts you must seek medical attention immediately by calling 911 or calling your MD immediately  if symptoms less severe.  You Must read complete instructions/literature along with all the possible adverse reactions/side effects for all the Medicines you take and that have been prescribed to you. Take any new Medicines after you have completely understood and accpet all the possible adverse reactions/side effects.   Do not drive, operating heavy machinery, perform activities at heights, swimming or participation in water activities or provide baby sitting services if your were admitted for syncope or siezures until you have seen by Primary MD or a Neurologist and advised to do so again.  Do not drive when taking Pain medications.    Do not take more than prescribed Pain, Sleep and Anxiety Medications  Special Instructions: If you have smoked or chewed Tobacco   in the last 2 yrs please stop smoking, stop any regular Alcohol  and or any Recreational drug use.  Wear Seat belts while driving.   Please note  You were cared for by a hospitalist during your hospital stay. If you have any questions about your discharge medications or the care you received while you were in the hospital after you are discharged, you can call the unit and asked to speak with the hospitalist on call if the hospitalist that took care of you is not available. Once you are discharged, your primary care physician will handle any further medical issues. Please note that NO REFILLS for any discharge medications will be authorized once you are discharged, as it is imperative that you return to your primary care physician (or establish a relationship with a primary care physician if you do not have one) for your aftercare needs so that they can reassess your need for medications and monitor your lab values.

## 2015-08-14 NOTE — Discharge Summary (Signed)
Derrick Andrews, is a 82 y.o. male  DOB [birth year]  MRN [id number].  Admission date:  08/07/2015  Admitting Physician  Bonnielee Haff, MD  Discharge Date:  08/14/2015   Primary MD  Horton Finer, MD  Recommendations for primary care physician for things to follow:  - Check CBC, BMP in 3 days - Repeat 2 view chest x-ray in one week - Patient discharge on low dose Lasix, and a total volume status closely, and adjust as needed   Admission Diagnosis  Cough [R05] Dehydration [E86.0] Fall, initial encounter [W19.XXXA] Urinary tract infection without hematuria, site unspecified [N39.0]   Discharge Diagnosis  Cough [R05] Dehydration [E86.0] Fall, initial encounter [W19.XXXA] Urinary tract infection without hematuria, site unspecified [N39.0]    Principal Problem:   UTI (urinary tract infection) Active Problems:   Cancer, bladder, neck (HCC)   Temporal arteritis (HCC)   Coronary artery disease   Cough   UTI (lower urinary tract infection)      Past Medical History  Diagnosis Date  . Hyperlipidemia   . Coronary artery disease ? 2010    a. hx LAD stent in 2003.  Marland Kitchen Cancer ([place])     a. 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  . Urinary frequency     AND SOMETIMES CAN'T TELL WHEN URINE IS COMING - DEPENDS AS NEEDED  . GERD (gastroesophageal reflux disease)   . Arthritis     IN MY BACK  . Hearing problem of both ears     WEARS BILATERAL HEARING AIDS  . Blind left eye     DIAGNOSED WITH ? TEMPORAL ARTERITIS - AND PUT ON PREDNISONE TO PREVENT BLINDNESS IN RIGHT EYE  . Unsteady gait     USES CANE WHEN AMBULATING  . Temporal arteritis ([place])     a. Biopsy proven per records.    Past Surgical History  Procedure Laterality Date  . Varicose veing stripping in 1950s    . Coronary angioplasty      HEART STENTING  . Tonsillectomy    .  Transurethral resection of prostate N/A 01/19/2014    Procedure: TRANSURETHRAL RESECTION OF BLADDER CANCER INCASIVE INTO RIGHT PROSTATE (TURP) AND RESECTION OF SUPERFICIALBLADDER CANCER OF TRIGONE AND BLADDER NECK;  Surgeon: Ailene Rud, MD;  Location: [facility];  Service: Urology;  Laterality: N/A;       History of present illness and  Hospital Course:     Kindly see H&P for history of present illness and admission details, please review complete Labs, Consult reports and Test reports for all details in brief  HPI  from the history and physical done on the day of admission 08/07/2015  HPI: Derrick Andrews is a 82 y.o. male with a past medical history of coronary artery disease, bladder cancer, temporal arteritis on chronic steroids, who lives with his daughter and was brought in today due to 2 falls and generalized weakness ongoing for the last 2 days. Most of the history was provided by the  patient's daughter. Apparently on Saturday patient wasn't feeling quite well. He didn't do his usual activities. On "Sunday, however, he went to church. He went to see his girlfriend. He seemed to be doing all right. On Monday his daughter had to go to work and when she came home she found him sleeping on the sofa. Apparently he had slept all day, which is very unusual for him. And then later in the day, he was sitting on the chair when he fell out of the chair. The daughter had to call the neighbors to assist him get in the bed. This morning patient was found by one of his caregivers on the floor. He called the neighbors to get him back in the bed. They took him to his primary care physician's office where he was found to have low blood pressure and so he was referred to the emergency department. There's been no nausea, vomiting or diarrhea. He has had a slight cough but no fever, no chills. No sick contacts. Denies any real discomfort with urination, although he does have prostate problems as a result of which  he has frequency and hesitation at times.  In the emergency department, patient was noted to have a low blood pressure. His UA was found to be abnormal. He was started having urinary tract infection. He was given fluids and his blood pressure responded appropriately.    Hospital Course   UTI  -Initially on IV Rocephin, urine culture growing Enterococcus,changed IV Rocephin to IV vancomycin on 3/2 , sensitivity showing ampicillin sensitive, so IV vancomycin narrowed to amoxicillin , finished another 5 days as an outpatient , charge on probiotics as well .  Hypotension - Dehydration and volume depletion, blood pressure is improving with hydration , resume Imdur on discharge - Initially on steroid stress dose hydrocortisone, tapered back to home dose steroid   History of temporal arteritis - On chronic steroids, tapered steroids down to his home dose  Cough , hypoxia /acute diastolic congestive heart failure - She developed cough, mild hypoxia, chest X-ray with evidence of volume overload and pulmonary edema noted, started on IV diuresis, most recent chest x-ray with significant improvement, then take 2 by mouth diuresis. - 2-D echo 3/6/201 poor echo window, could not evaluate for valvular heart disease, but significant for grade 1 diastolic dysfunction  History of coronary artery disease - Denies any chest pain or shortness of breath - Troponins negative 3, EKG nonacute - Continue with aspirin  Hyperlipidemia  - Continue with statin   Hypokalemia - repleted,   Acute delerium - Currently improved     Discharge Condition:  stable   Follow UP  Follow-up Information    Follow up with [facility].   Specialty:  Skilled Nursing Facility   Contact information:   [address] [place] [address] [phone]      Follow up with OSBORNE,JAMES CHARLES, MD.   Specialty:  Internal Medicine   Why:  After discharge from SNF   Contact information:     [address] [place] [address] [phone]         Discharge Instructions  and  Discharge Medications     Discharge Instructions    Discharge instructions    Complete by:  As directed   Follow with Primary MD Horton Finer, MD in 7 days   Get CBC, CMP, 2 view Chest X ray checked  by Primary MD next visit.    Activity: As  tolerated with Full fall precautions use walker/cane & assistance as needed   Disposition SNF   Diet: Heart Healthy  , with feeding assistance and aspiration precautions.  For Heart failure patients - Check your Weight same time everyday, if you gain over 2 pounds, or you develop in leg swelling, experience more shortness of breath or chest pain, call your Primary MD immediately. Follow Cardiac Low Salt Diet and 1.5 lit/day fluid restriction.   On your next visit with your primary care physician please Get Medicines reviewed and adjusted.   Please request your Prim.MD to go over all Hospital Tests and Procedure/Radiological results at the follow up, please get all Hospital records sent to your Prim MD by signing hospital release before you go home.   If you experience worsening of your admission symptoms, develop shortness of breath, life threatening emergency, suicidal or homicidal thoughts you must seek medical attention immediately by calling 911 or calling your MD immediately  if symptoms less severe.  You Must read complete instructions/literature along with all the possible adverse reactions/side effects for all the Medicines you take and that have been prescribed to you. Take any new Medicines after you have completely understood and accpet all the possible adverse reactions/side effects.   Do not drive, operating heavy machinery, perform activities at heights, swimming or participation in water activities or provide baby sitting services if your were admitted for syncope or siezures until you have seen by Primary MD or  a Neurologist and advised to do so again.  Do not drive when taking Pain medications.    Do not take more than prescribed Pain, Sleep and Anxiety Medications  Special Instructions: If you have smoked or chewed Tobacco  in the last 2 yrs please stop smoking, stop any regular Alcohol  and or any Recreational drug use.  Wear Seat belts while driving.   Please note  You were cared for by a hospitalist during your hospital stay. If you have any questions about your discharge medications or the care you received while you were in the hospital after you are discharged, you can call the unit and asked to speak with the hospitalist on call if the hospitalist that took care of you is not available. Once you are discharged, your primary care physician will handle any further medical issues. Please note that NO REFILLS for any discharge medications will be authorized once you are discharged, as it is imperative that you return to your primary care physician (or establish a relationship with a primary care physician if you do not have one) for your aftercare needs so that they can reassess your need for medications and monitor your lab values.     Increase activity slowly    Complete by:  As directed             Medication List    TAKE these medications        acetaminophen 325 MG tablet  Commonly known as:  TYLENOL  Take 650 mg by mouth every 6 (six) hours as needed for moderate pain (arthritis pain).     albuterol (2.5 MG/3ML) 0.083% nebulizer solution  Commonly known as:  PROVENTIL  Take 3 mLs (2.5 mg total) by nebulization every 6 (six) hours as needed for wheezing.     amoxicillin 500 MG capsule  Commonly known as:  AMOXIL  Take 1 capsule (500 mg total) by mouth every 8 (eight) hours. Take for 5 days then stop     aspirin 81  MG tablet  Take 81 mg by mouth daily.     bisacodyl 5 MG EC tablet  Commonly known as:  DULCOLAX  Take 1 tablet (5 mg total) by mouth daily as needed for  moderate constipation.     cyanocobalamin 1000 MCG/ML injection  Commonly known as:  (VITAMIN B-12)  Inject 1,000 mcg into the muscle every 30 (thirty) days.     docusate sodium 100 MG capsule  Commonly known as:  COLACE  Take 1 capsule (100 mg total) by mouth 2 (two) times daily.     furosemide 40 MG tablet  Commonly known as:  LASIX  Take 0.5 tablets (20 mg total) by mouth daily.     guaiFENesin 600 MG 12 hr tablet  Commonly known as:  MUCINEX  Take 2 tablets (1,200 mg total) by mouth 2 (two) times daily.     isosorbide mononitrate 60 MG 24 hr tablet  Commonly known as:  IMDUR  Take 1 tablet (60 mg total) by mouth daily.     magnesium hydroxide 400 MG/5ML suspension  Commonly known as:  MILK OF MAGNESIA  Take 30 mLs by mouth daily as needed for mild constipation.     nitroGLYCERIN 0.4 MG SL tablet  Commonly known as:  NITROSTAT  Place 1 tablet (0.4 mg total) under the tongue every 5 (five) minutes as needed for chest pain.     omeprazole 20 MG capsule  Commonly known as:  PRILOSEC  Take 20 mg by mouth daily.     oxybutynin 5 MG tablet  Commonly known as:  DITROPAN  Take 5 mg by mouth 2 (two) times daily.     pravastatin 40 MG tablet  Commonly known as:  PRAVACHOL  Take 40 mg by mouth at bedtime.     predniSONE 1 MG tablet  Commonly known as:  DELTASONE  Take 2 mg by mouth every morning.     saccharomyces boulardii 250 MG capsule  Commonly known as:  FLORASTOR  Take 1 capsule (250 mg total) by mouth 2 (two) times daily.     senna-docusate 8.6-50 MG tablet  Commonly known as:  Senokot-S  Take 3 tablets by mouth at bedtime.     Vitamin D (Ergocalciferol) 50000 units Caps capsule  Commonly known as:  DRISDOL  Take 50,000 Units by mouth once a week.          Diet and Activity recommendation: See Discharge Instructions above   Consults obtained -  nnoe   Major procedures and Radiology Reports - PLEASE review detailed and final reports for all details,  in brief -      Dg Chest Port 1 View  08/13/2015  CLINICAL DATA:  82 year old male with cough and shortness of Breath. Initial encounter. EXAM: PORTABLE CHEST 1 VIEW COMPARISON:  08/12/2015 and earlier. FINDINGS: Widespread reticulonodular opacity in both lungs superimposed on basilar predominant chronic interstitial changes. Improved lung volumes since yesterday, still somewhat low compared to baseline. No pneumothorax or pleural effusion. Mediastinal contours remain within normal limits. Since 08/11/2015 ventilation bilaterally has improved. IMPRESSION: Mildly improved ventilation since 08/11/2015. Appearance could reflect regression of bilateral pneumonia or pulmonary edema superimposed on chronic lung disease. Electronically Signed   By: Genevie Ann M.D.   On: 08/13/2015 07:16   Dg Chest Port 1 View  08/12/2015  CLINICAL DATA:  Cough, prostate cancer, former smoker EXAM: PORTABLE CHEST 1 VIEW COMPARISON:  Portable exam [id number] hours compared 08/11/2015 FINDINGS: Stable heart size and mediastinal contours. Diffuse BILATERAL  pulmonary infiltrates likely representing pulmonary edema though infection not entirely excluded. No gross pleural effusion or pneumothorax. Bones demineralized. IMPRESSION: Severe diffuse BILATERAL pulmonary infiltrates favoring pulmonary edema, little changed. Electronically Signed   By: Lavonia Dana M.D.   On: 08/12/2015 07:13   Dg Chest Port 1 View  08/11/2015  CLINICAL DATA:  Shortness of breath. EXAM: PORTABLE CHEST 1 VIEW COMPARISON:  August 07, 2015 FINDINGS: No pneumothorax. Increasing pulmonary edema. No other interval changes. IMPRESSION: Increasing pulmonary edema. Electronically Signed   By: Dorise Bullion III M.D   On: 08/11/2015 09:21   Dg Chest Port 1 View  08/07/2015  CLINICAL DATA:  Lethargy; fall from bed 1 day prior. Chronic cough. History of prostate carcinoma EXAM: PORTABLE CHEST 1 VIEW COMPARISON:  August 02, 2014 FINDINGS: There is widespread pulmonary fibrotic  type change, stable. No frank edema or consolidation. Heart is upper normal in size with pulmonary vascularity within normal limits. No adenopathy. No blastic or lytic bone lesions are apparent. A prior lower thoracic vertebral body fracture is better seen on prior study with lateral view included. IMPRESSION: Widespread pulmonary fibrotic type change, stable. No frank edema or consolidation. No change in cardiac silhouette. Electronically Signed   By: Lowella Grip III M.D.   On: 08/07/2015 17:36    Micro Results     Recent Results (from the past 240 hour(s))  Culture, Urine     Status: None (Preliminary result)   Collection Time: 08/07/15  3:55 PM  Result Value Ref Range Status   Specimen Description URINE, CLEAN CATCH  Final   Special Requests NONE  Final   Culture   Final    >=100,000 COLONIES/mL ENTEROCOCCUS SPECIES HOLDING FOR POSSIBLE SECOND ORGANISM SENDING TO REFERENCE LAB TO CONFIRM Performed at [facility]    Report Status PENDING  Incomplete   Organism ID, Bacteria ENTEROCOCCUS SPECIES  Final      Susceptibility   Enterococcus species - MIC*    AMPICILLIN <=2 SENSITIVE Sensitive     LEVOFLOXACIN 0.5 SENSITIVE Sensitive     NITROFURANTOIN <=16 SENSITIVE Sensitive     VANCOMYCIN 1 SENSITIVE Sensitive     * >=100,000 COLONIES/mL ENTEROCOCCUS SPECIES  Culture, blood (Routine X 2) w Reflex to ID Panel     Status: None (Preliminary result)   Collection Time: 08/09/15  5:27 PM  Result Value Ref Range Status   Specimen Description BLOOD RIGHT HAND  Final   Special Requests   Final    BOTTLES DRAWN AEROBIC AND ANAEROBIC BLUE 10CC RED 3CC   Culture   Final    NO GROWTH 4 DAYS Performed at [facility]    Report Status PENDING  Incomplete  Culture, blood (Routine X 2) w Reflex to ID Panel     Status: None (Preliminary result)   Collection Time: 08/09/15  5:30 PM  Result Value Ref Range Status   Specimen Description BLOOD RIGHT ARM  Final   Special  Requests BOTTLES DRAWN AEROBIC AND ANAEROBIC 10CC  Final   Culture   Final    NO GROWTH 4 DAYS Performed at [facility]    Report Status PENDING  Incomplete       Today   Subjective:   Derrick Andrews today has no headache,no chest or abdominal pain, good bowel movement overnight after receiving laxative, feels much bettertoday.   Objective:   Blood pressure 133/63, pulse 71, temperature 98.2 F (36.8 C), temperature source Oral, resp. rate 18, height 6\' 3"  (  1.905 m), weight 78.4 kg (172 lb 13.5 oz), SpO2 92 %.   Intake/Output Summary (Last 24 hours) at 08/14/15 1241 Last data filed at 08/14/15 0655  Gross per 24 hour  Intake    840 ml  Output      0 ml  Net    840 ml    Exam Awake and communicative today Supple Neck,No JVD,  Symmetrical Chest wall movement, Good air movement bilaterally, no wheezing RRR,No Gallops,Rubs or new Murmurs, No Parasternal Heave +ve B.Sounds, Abd Soft, No tenderness, No organomegaly appriciated, No rebound - guarding or rigidity. No Cyanosis, Clubbing or edema, No new Rash or bruise   Data Review   CBC w Diff: Lab Results  Component Value Date   WBC 9.6 08/10/2015   HGB 13.0 08/10/2015   HCT 38.8* 08/10/2015   PLT 183 08/10/2015   LYMPHOPCT 6* 06/19/2014   MONOPCT 8 06/19/2014   EOSPCT 1 06/19/2014   BASOPCT 0 06/19/2014    CMP: Lab Results  Component Value Date   NA 137 08/13/2015   K 3.5 08/13/2015   CL 100* 08/13/2015   CO2 30 08/13/2015   BUN 21* 08/13/2015   CREATININE 0.76 08/13/2015   PROT 5.5* 08/08/2015   ALBUMIN 2.8* 08/08/2015   BILITOT 0.9 08/08/2015   ALKPHOS 40 08/08/2015   AST 24 08/08/2015   ALT 12* 08/08/2015  .   Total Time in preparing paper work, data evaluation and todays exam - 35 minutes  Lilinoe Acklin M.D on 08/14/2015 at 12:41 PM  [facility]   Office  [phone]

## 2015-08-14 NOTE — Progress Notes (Signed)
Pt for discharge to [facility] and [facility].  CSW facilitated pt discharge needs including contacting facility, faxing pt discharge information via epic hub, discussing with pt daughter, Manuela Schwartz via telephone, providing RN phone number to call report, received [facility] Josem Kaufmann (auth#: [id number]) and arranging ambulance transport for pt to [facility] and [facility].  Pt and pt daughter coping appropriately with transition to SNF.   No further social work needs identified at this time.  CSW signing off.   Alison Murray, MSW, [employer] [facility] [phone]

## 2015-08-21 LAB — URINE CULTURE: Culture: >=100000

## 2016-12-02 ENCOUNTER — Other Ambulatory Visit: Payer: Self-pay | Admitting: Internal Medicine

## 2016-12-02 ENCOUNTER — Ambulatory Visit
Admission: RE | Admit: 2016-12-02 | Discharge: 2016-12-02 | Disposition: A | Discharge status: Home / Self Care | Payer: Medicare Other | Source: Ambulatory Visit | Attending: Internal Medicine | Admitting: Internal Medicine

## 2016-12-02 DIAGNOSIS — J410 Simple chronic bronchitis: Secondary | ICD-10-CM

## 2017-06-11 DIAGNOSIS — H33023 Retinal detachment with multiple breaks, bilateral: Secondary | ICD-10-CM | POA: Diagnosis not present

## 2017-06-11 DIAGNOSIS — Z961 Presence of intraocular lens: Secondary | ICD-10-CM | POA: Diagnosis not present

## 2017-06-11 DIAGNOSIS — H18422 Band keratopathy, left eye: Secondary | ICD-10-CM | POA: Diagnosis not present

## 2017-06-11 DIAGNOSIS — H3412 Central retinal artery occlusion, left eye: Secondary | ICD-10-CM | POA: Diagnosis not present

## 2017-07-10 ENCOUNTER — Other Ambulatory Visit (HOSPITAL_COMMUNITY): Payer: Self-pay | Admitting: Geriatric Medicine

## 2017-07-10 DIAGNOSIS — I10 Essential (primary) hypertension: Secondary | ICD-10-CM | POA: Diagnosis not present

## 2017-07-10 DIAGNOSIS — N3281 Overactive bladder: Secondary | ICD-10-CM | POA: Diagnosis not present

## 2017-07-10 DIAGNOSIS — E7800 Pure hypercholesterolemia, unspecified: Secondary | ICD-10-CM | POA: Diagnosis not present

## 2017-07-10 DIAGNOSIS — E440 Moderate protein-calorie malnutrition: Secondary | ICD-10-CM | POA: Diagnosis not present

## 2017-07-10 DIAGNOSIS — K5903 Drug induced constipation: Secondary | ICD-10-CM | POA: Diagnosis not present

## 2017-07-10 DIAGNOSIS — R1314 Dysphagia, pharyngoesophageal phase: Secondary | ICD-10-CM | POA: Diagnosis not present

## 2017-07-10 DIAGNOSIS — R1310 Dysphagia, unspecified: Secondary | ICD-10-CM

## 2017-07-10 DIAGNOSIS — Z79899 Other long term (current) drug therapy: Secondary | ICD-10-CM | POA: Diagnosis not present

## 2017-07-10 DIAGNOSIS — J8410 Pulmonary fibrosis, unspecified: Secondary | ICD-10-CM | POA: Diagnosis not present

## 2017-07-13 ENCOUNTER — Ambulatory Visit (HOSPITAL_COMMUNITY)
Admission: RE | Admit: 2017-07-13 | Discharge: 2017-07-13 | Disposition: A | Discharge status: Home / Self Care | Payer: Medicare HMO | Source: Ambulatory Visit | Attending: Geriatric Medicine | Admitting: Geriatric Medicine

## 2017-07-13 DIAGNOSIS — R1310 Dysphagia, unspecified: Secondary | ICD-10-CM

## 2017-07-13 DIAGNOSIS — R1312 Dysphagia, oropharyngeal phase: Secondary | ICD-10-CM | POA: Insufficient documentation

## 2017-07-22 ENCOUNTER — Ambulatory Visit
Admission: RE | Admit: 2017-07-22 | Discharge: 2017-07-22 | Disposition: A | Discharge status: Home / Self Care | Payer: Medicare HMO | Source: Ambulatory Visit | Attending: Geriatric Medicine | Admitting: Geriatric Medicine

## 2017-07-22 ENCOUNTER — Other Ambulatory Visit: Payer: Self-pay | Admitting: Geriatric Medicine

## 2017-07-22 DIAGNOSIS — E440 Moderate protein-calorie malnutrition: Secondary | ICD-10-CM | POA: Diagnosis not present

## 2017-07-22 DIAGNOSIS — J8410 Pulmonary fibrosis, unspecified: Secondary | ICD-10-CM | POA: Diagnosis not present

## 2017-07-22 DIAGNOSIS — R0602 Shortness of breath: Secondary | ICD-10-CM | POA: Diagnosis not present

## 2017-07-22 DIAGNOSIS — I10 Essential (primary) hypertension: Secondary | ICD-10-CM | POA: Diagnosis not present

## 2017-07-22 DIAGNOSIS — K219 Gastro-esophageal reflux disease without esophagitis: Secondary | ICD-10-CM | POA: Diagnosis not present

## 2017-07-22 DIAGNOSIS — R1314 Dysphagia, pharyngoesophageal phase: Secondary | ICD-10-CM | POA: Diagnosis not present

## 2017-07-27 ENCOUNTER — Other Ambulatory Visit: Payer: Self-pay | Admitting: Geriatric Medicine

## 2017-07-27 DIAGNOSIS — R1313 Dysphagia, pharyngeal phase: Secondary | ICD-10-CM | POA: Diagnosis not present

## 2017-07-27 DIAGNOSIS — J8410 Pulmonary fibrosis, unspecified: Secondary | ICD-10-CM | POA: Diagnosis not present

## 2017-07-28 ENCOUNTER — Ambulatory Visit
Admission: RE | Admit: 2017-07-28 | Discharge: 2017-07-28 | Disposition: A | Discharge status: Home / Self Care | Payer: Medicare HMO | Source: Ambulatory Visit | Attending: Geriatric Medicine | Admitting: Geriatric Medicine

## 2017-07-28 DIAGNOSIS — J8410 Pulmonary fibrosis, unspecified: Secondary | ICD-10-CM

## 2017-07-28 DIAGNOSIS — J189 Pneumonia, unspecified organism: Secondary | ICD-10-CM | POA: Diagnosis not present

## 2017-08-03 DIAGNOSIS — D510 Vitamin B12 deficiency anemia due to intrinsic factor deficiency: Secondary | ICD-10-CM | POA: Diagnosis not present

## 2017-09-07 DEATH — deceased

## 2018-05-18 IMAGING — RF DG SWALLOWING FUNCTION
11 of 14 series · 16 of 24 positions shown · non-contrast
Comparison: None.

CLINICAL DATA: Dysphagia

EXAM:
MODIFIED BARIUM SWALLOW
TECHNIQUE: Different consistencies of barium were administered orally to the
patient by the Speech Pathologist. Imaging of the pharynx was
performed in the lateral projection.
FLUOROSCOPY TIME:  Fluoroscopy Time:  2.3 min
Radiation Exposure Index (if provided by the fluoroscopic device):
6.8 mGy
Number of Acquired Spot Images: 0

[Series 1: cp_standard · 0.17mm/px · 1 of 1 slices shown (1 of 11)]
[im 1/1]
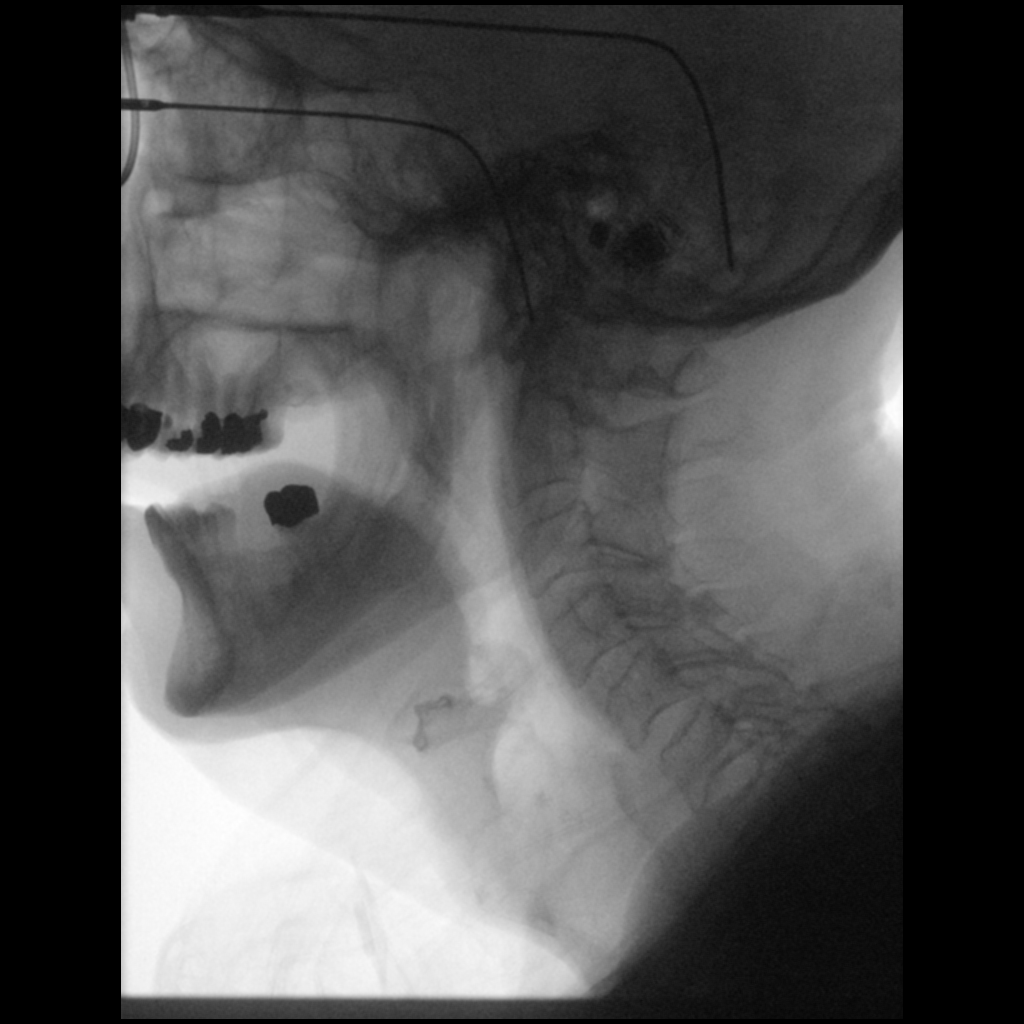

[Series 3: cp_standard · 0.34mm/px · 2 of 211 frames shown (2 of 11)]
[frame 1/211]
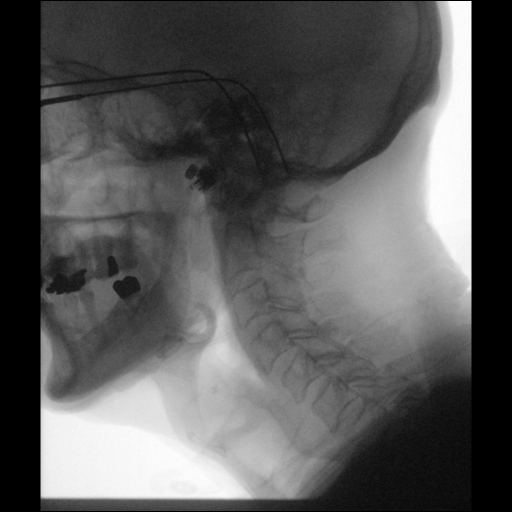
[frame 106/211]
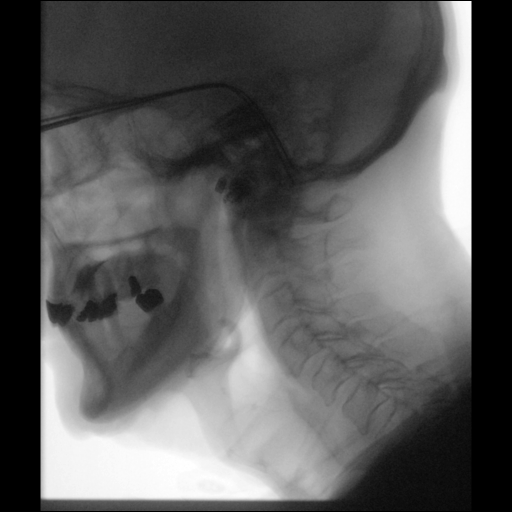

[Series 4: cp_standard · 0.34mm/px · 1 of 35 frames shown (3 of 11)]
[frame 30/35]
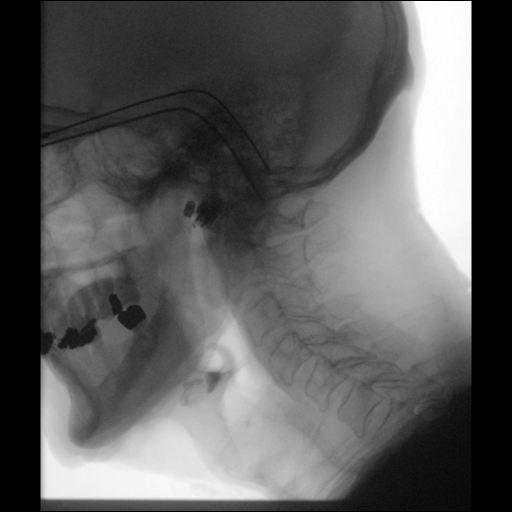

[Series 5: cp_standard · 0.34mm/px · 1 of 14 frames shown (4 of 11)]
[frame 3/14]
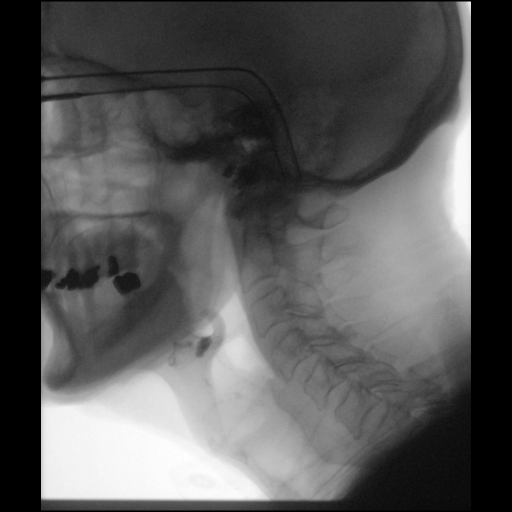

[Series 6: cp_standard · 0.34mm/px · 2 of 239 frames shown (5 of 11)]
[frame 120/239]
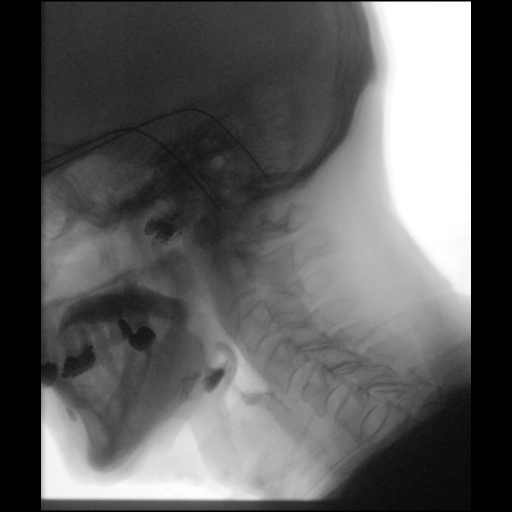
[frame 239/239]
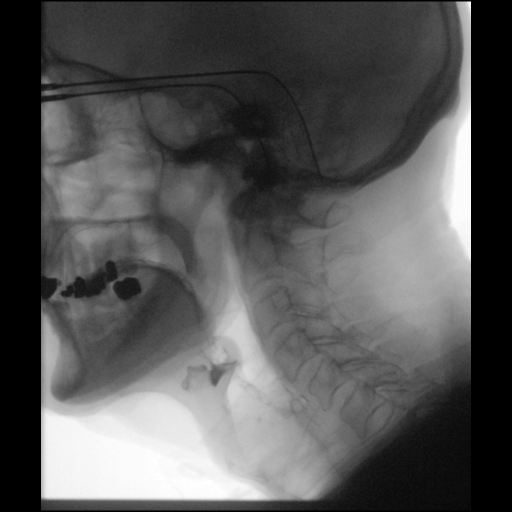

[Series 8: cp_standard · 0.35mm/px · 2 of 155 frames shown (6 of 11)]
[frame 12/155]
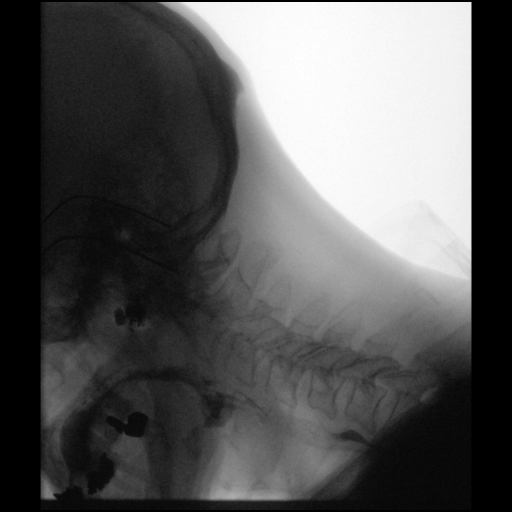
[frame 78/155]
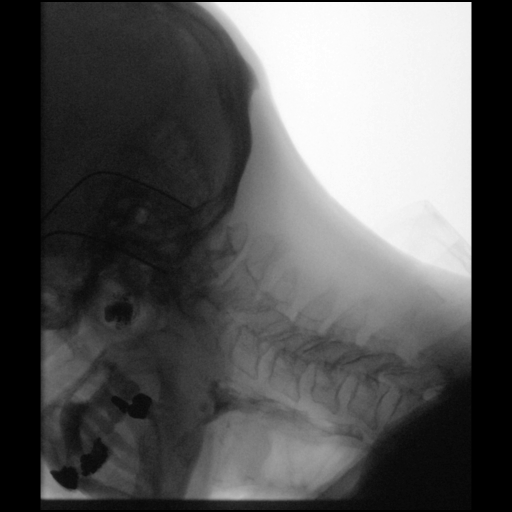

[Series 9: cp_standard · 0.34mm/px · 1 of 138 frames shown (7 of 11)]
[frame 118/138]
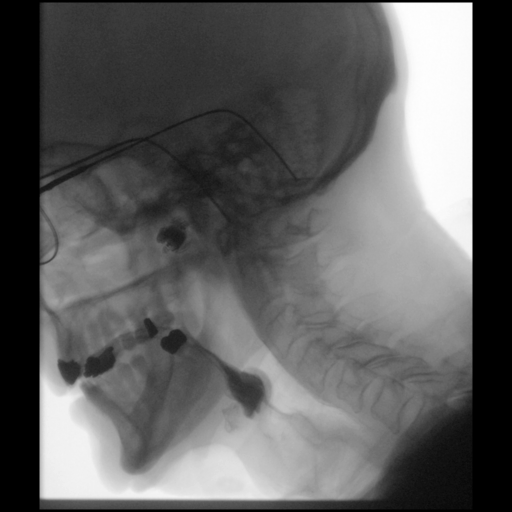

[Series 10: cp_standard · 0.34mm/px · 1 of 150 frames shown (8 of 11)]
[frame 34/150]
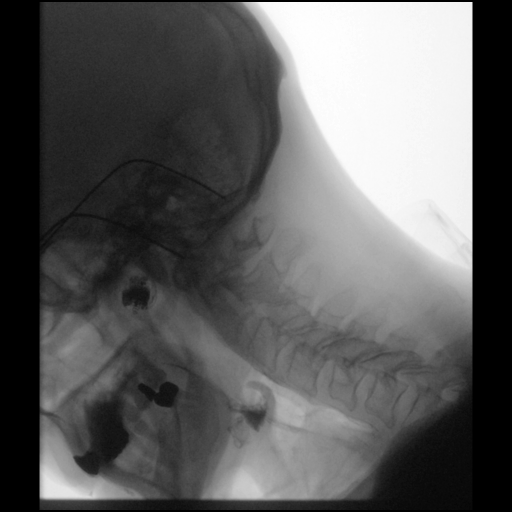

[Series 12: cp_standard · 0.34mm/px · 2 of 255 frames shown (9 of 11)]
[frame 39/255]
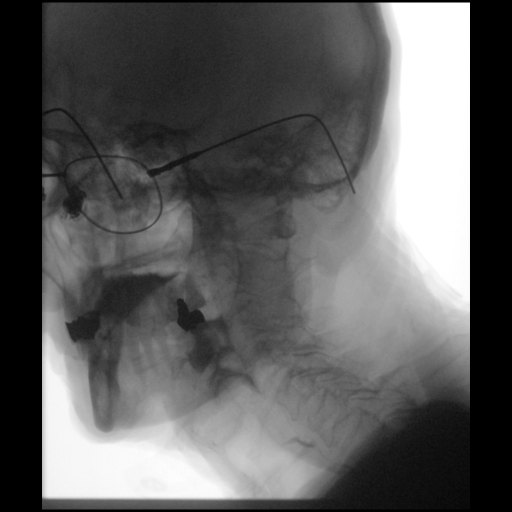
[frame 217/255]
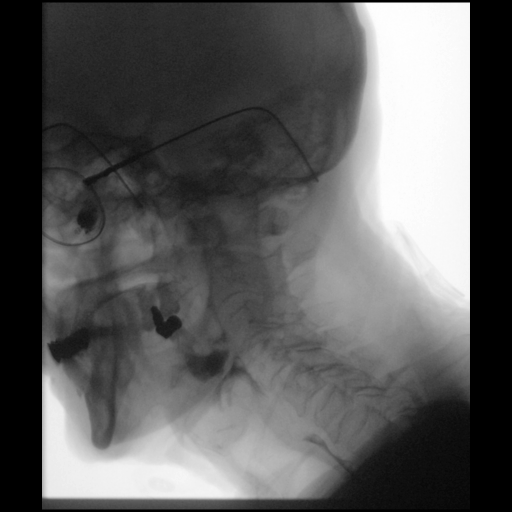

[Series 14: cp_standard · 0.34mm/px · 2 of 351 frames shown (10 of 11)]
[frame 53/351]
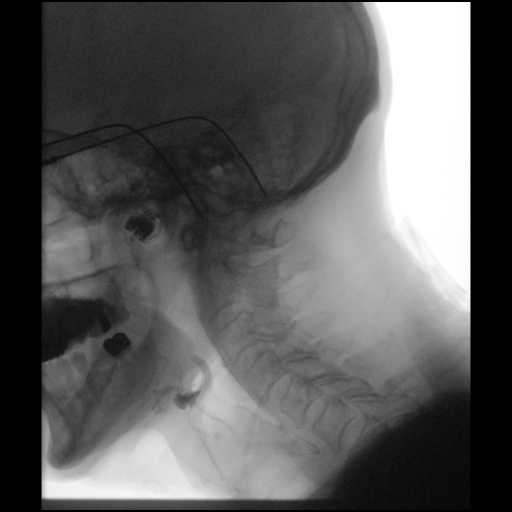
[frame 299/351]
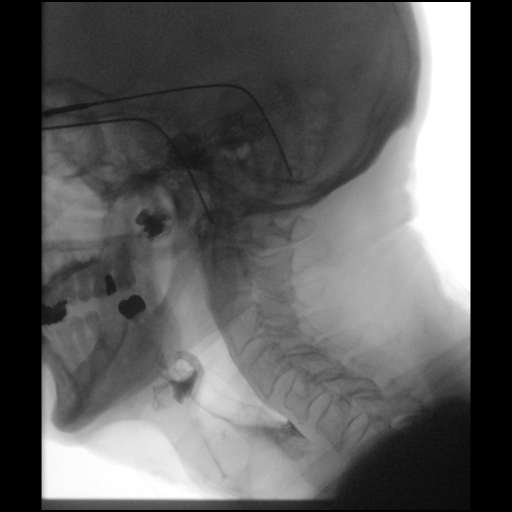

[Series 15: cp_standard · 0.34mm/px · 1 of 7 frames shown (11 of 11)]
[frame 6/7]
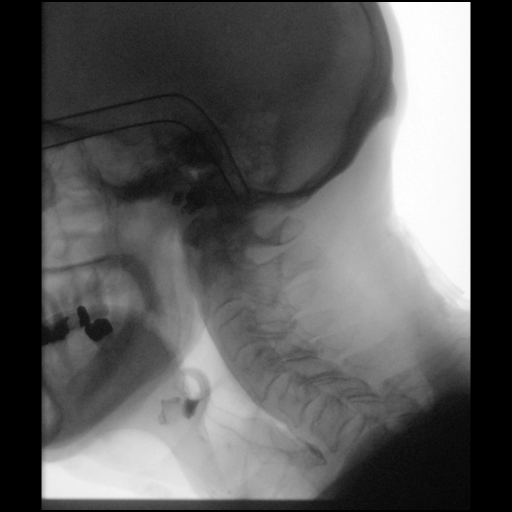

[16 of 24 positions shown; findings below may reference images not displayed]

FINDINGS: Thin liquid-piecemeal swallowing. There is laryngeal penetration
which is not improved with chin tuck. At some instances, the
penetration reach the level of the cords. Large amount of retention
in the vallecula.

Nectar thick liquid-not assessed

Honey-not assessed

Melodi?Rachel swallowing. Large amount of retention in the
vallecula.

Cracker-not assessed

Melodi?Freeman with cracker-large amount of retention in the vallecula. The
patient swallowed puree with cracker with thin liquids which
penetrated to the level of the cords.

Barium tablet -  within normal limits
IMPRESSION: Piecemeal swallowing.

Large amount of retention in the vallecula with all consistencies.

Laryngeal penetration with thin liquids which reached the level of
the cords.

Please refer to the Speech Pathologists report for complete details
and recommendations.
